# Patient Record
Sex: Female | Born: 1950 | Race: White | Hispanic: No | Marital: Married | Smoking: Former smoker
Health system: Southern US, Community
[De-identification: ages and names within clinical notes are randomized; demographics above are authoritative.]

## PROBLEM LIST (undated history)

## (undated) DIAGNOSIS — T7840XA Allergy, unspecified, initial encounter: Secondary | ICD-10-CM

## (undated) DIAGNOSIS — M8580 Other specified disorders of bone density and structure, unspecified site: Secondary | ICD-10-CM

## (undated) DIAGNOSIS — M1990 Unspecified osteoarthritis, unspecified site: Secondary | ICD-10-CM

## (undated) DIAGNOSIS — C439 Malignant melanoma of skin, unspecified: Secondary | ICD-10-CM

## (undated) DIAGNOSIS — M810 Age-related osteoporosis without current pathological fracture: Secondary | ICD-10-CM

## (undated) DIAGNOSIS — I10 Essential (primary) hypertension: Secondary | ICD-10-CM

## (undated) HISTORY — DX: Allergy, unspecified, initial encounter: T78.40XA

## (undated) HISTORY — DX: Malignant melanoma of skin, unspecified: C43.9

## (undated) HISTORY — PX: FRACTURE SURGERY: SHX138

## (undated) HISTORY — DX: Age-related osteoporosis without current pathological fracture: M81.0

## (undated) HISTORY — PX: ABDOMINAL HYSTERECTOMY: SHX81

## (undated) HISTORY — DX: Essential (primary) hypertension: I10

## (undated) HISTORY — PX: BREAST EXCISIONAL BIOPSY: SUR124

## (undated) HISTORY — DX: Unspecified osteoarthritis, unspecified site: M19.90

## (undated) HISTORY — DX: Other specified disorders of bone density and structure, unspecified site: M85.80

---

## 1982-10-24 HISTORY — PX: TUBAL LIGATION: SHX77

## 2000-02-08 ENCOUNTER — Encounter: Admission: RE | Admit: 2000-02-08 | Discharge: 2000-02-08 | Payer: Self-pay | Admitting: Obstetrics and Gynecology

## 2000-02-08 ENCOUNTER — Encounter: Payer: Self-pay | Admitting: Obstetrics and Gynecology

## 2000-02-14 ENCOUNTER — Encounter: Admission: RE | Admit: 2000-02-14 | Discharge: 2000-02-14 | Payer: Self-pay | Admitting: Obstetrics and Gynecology

## 2000-02-14 ENCOUNTER — Encounter: Payer: Self-pay | Admitting: Obstetrics and Gynecology

## 2000-07-28 ENCOUNTER — Other Ambulatory Visit: Admission: RE | Admit: 2000-07-28 | Discharge: 2000-07-28 | Payer: Self-pay | Admitting: Obstetrics and Gynecology

## 2000-07-31 ENCOUNTER — Encounter (INDEPENDENT_AMBULATORY_CARE_PROVIDER_SITE_OTHER): Payer: Self-pay | Admitting: Specialist

## 2000-07-31 ENCOUNTER — Other Ambulatory Visit: Admission: RE | Admit: 2000-07-31 | Discharge: 2000-07-31 | Payer: Self-pay | Admitting: Obstetrics and Gynecology

## 2000-10-24 HISTORY — PX: SALPINGOOPHORECTOMY: SHX82

## 2001-02-15 ENCOUNTER — Encounter: Payer: Self-pay | Admitting: Obstetrics and Gynecology

## 2001-02-15 ENCOUNTER — Encounter: Admission: RE | Admit: 2001-02-15 | Discharge: 2001-02-15 | Payer: Self-pay | Admitting: Obstetrics and Gynecology

## 2001-09-05 ENCOUNTER — Other Ambulatory Visit: Admission: RE | Admit: 2001-09-05 | Discharge: 2001-09-05 | Payer: Self-pay | Admitting: Obstetrics and Gynecology

## 2001-09-07 ENCOUNTER — Encounter: Payer: Self-pay | Admitting: Obstetrics and Gynecology

## 2001-09-07 ENCOUNTER — Ambulatory Visit (HOSPITAL_COMMUNITY): Admission: RE | Admit: 2001-09-07 | Discharge: 2001-09-07 | Payer: Self-pay | Admitting: Obstetrics and Gynecology

## 2001-09-13 ENCOUNTER — Encounter (INDEPENDENT_AMBULATORY_CARE_PROVIDER_SITE_OTHER): Payer: Self-pay | Admitting: Specialist

## 2001-09-13 ENCOUNTER — Inpatient Hospital Stay (HOSPITAL_COMMUNITY): Admission: RE | Admit: 2001-09-13 | Discharge: 2001-09-15 | Payer: Self-pay | Admitting: Obstetrics and Gynecology

## 2002-02-18 ENCOUNTER — Encounter: Payer: Self-pay | Admitting: Obstetrics and Gynecology

## 2002-02-18 ENCOUNTER — Encounter: Admission: RE | Admit: 2002-02-18 | Discharge: 2002-02-18 | Payer: Self-pay | Admitting: Obstetrics and Gynecology

## 2002-03-11 ENCOUNTER — Ambulatory Visit (HOSPITAL_BASED_OUTPATIENT_CLINIC_OR_DEPARTMENT_OTHER): Admission: RE | Admit: 2002-03-11 | Discharge: 2002-03-11 | Payer: Self-pay | Admitting: *Deleted

## 2002-03-11 ENCOUNTER — Encounter (INDEPENDENT_AMBULATORY_CARE_PROVIDER_SITE_OTHER): Payer: Self-pay | Admitting: *Deleted

## 2002-09-09 ENCOUNTER — Other Ambulatory Visit: Admission: RE | Admit: 2002-09-09 | Discharge: 2002-09-09 | Payer: Self-pay | Admitting: Obstetrics and Gynecology

## 2002-10-24 HISTORY — PX: OTHER SURGICAL HISTORY: SHX169

## 2003-02-21 ENCOUNTER — Encounter: Admission: RE | Admit: 2003-02-21 | Discharge: 2003-02-21 | Payer: Self-pay | Admitting: *Deleted

## 2003-02-21 ENCOUNTER — Encounter: Payer: Self-pay | Admitting: *Deleted

## 2003-07-02 ENCOUNTER — Emergency Department (HOSPITAL_COMMUNITY): Admission: EM | Admit: 2003-07-02 | Discharge: 2003-07-02 | Payer: Self-pay | Admitting: Emergency Medicine

## 2003-07-02 ENCOUNTER — Encounter: Payer: Self-pay | Admitting: Emergency Medicine

## 2003-12-25 ENCOUNTER — Other Ambulatory Visit: Admission: RE | Admit: 2003-12-25 | Discharge: 2003-12-25 | Payer: Self-pay | Admitting: Obstetrics and Gynecology

## 2004-02-24 ENCOUNTER — Encounter: Admission: RE | Admit: 2004-02-24 | Discharge: 2004-02-24 | Payer: Self-pay | Admitting: Obstetrics and Gynecology

## 2004-03-03 ENCOUNTER — Encounter (HOSPITAL_COMMUNITY): Admission: RE | Admit: 2004-03-03 | Discharge: 2004-06-01 | Payer: Self-pay | Admitting: Obstetrics and Gynecology

## 2004-10-24 HISTORY — PX: EYE SURGERY: SHX253

## 2004-12-06 ENCOUNTER — Ambulatory Visit: Payer: Self-pay | Admitting: Internal Medicine

## 2004-12-13 ENCOUNTER — Ambulatory Visit: Payer: Self-pay | Admitting: Oncology

## 2005-01-04 ENCOUNTER — Ambulatory Visit: Payer: Self-pay | Admitting: Internal Medicine

## 2005-02-03 ENCOUNTER — Other Ambulatory Visit: Admission: RE | Admit: 2005-02-03 | Discharge: 2005-02-03 | Payer: Self-pay | Admitting: Obstetrics and Gynecology

## 2005-03-17 ENCOUNTER — Encounter: Admission: RE | Admit: 2005-03-17 | Discharge: 2005-03-17 | Payer: Self-pay | Admitting: Obstetrics and Gynecology

## 2005-06-13 ENCOUNTER — Ambulatory Visit: Payer: Self-pay | Admitting: Hematology and Oncology

## 2005-12-14 ENCOUNTER — Ambulatory Visit: Payer: Self-pay | Admitting: Hematology and Oncology

## 2006-03-21 ENCOUNTER — Encounter: Admission: RE | Admit: 2006-03-21 | Discharge: 2006-03-21 | Payer: Self-pay | Admitting: Obstetrics and Gynecology

## 2006-04-07 ENCOUNTER — Encounter: Admission: RE | Admit: 2006-04-07 | Discharge: 2006-04-07 | Payer: Self-pay | Admitting: Obstetrics and Gynecology

## 2006-05-17 ENCOUNTER — Other Ambulatory Visit: Admission: RE | Admit: 2006-05-17 | Discharge: 2006-05-17 | Payer: Self-pay | Admitting: Obstetrics and Gynecology

## 2006-08-09 ENCOUNTER — Ambulatory Visit: Payer: Self-pay | Admitting: Hematology and Oncology

## 2007-03-23 ENCOUNTER — Encounter: Admission: RE | Admit: 2007-03-23 | Discharge: 2007-03-23 | Payer: Self-pay | Admitting: Obstetrics and Gynecology

## 2007-09-07 ENCOUNTER — Encounter: Admission: RE | Admit: 2007-09-07 | Discharge: 2007-09-07 | Payer: Self-pay | Admitting: Obstetrics and Gynecology

## 2008-03-25 ENCOUNTER — Encounter: Admission: RE | Admit: 2008-03-25 | Discharge: 2008-03-25 | Payer: Self-pay | Admitting: Obstetrics and Gynecology

## 2009-03-26 ENCOUNTER — Encounter: Admission: RE | Admit: 2009-03-26 | Discharge: 2009-03-26 | Payer: Self-pay | Admitting: Obstetrics and Gynecology

## 2009-12-16 ENCOUNTER — Encounter (INDEPENDENT_AMBULATORY_CARE_PROVIDER_SITE_OTHER): Payer: Self-pay | Admitting: *Deleted

## 2010-03-29 ENCOUNTER — Encounter: Admission: RE | Admit: 2010-03-29 | Discharge: 2010-03-29 | Payer: Self-pay | Admitting: Obstetrics and Gynecology

## 2010-07-30 ENCOUNTER — Telehealth: Payer: Self-pay | Admitting: Internal Medicine

## 2010-11-23 NOTE — Letter (Signed)
Summary: Colonoscopy Letter  [facility]  [address] [place], [place] [address]   Phone: [phone]  Fax: [phone]      December 16, 2009 MRN: [id number]   [facility] [address] [place], [place]  [address]   Dear Ms. Clouatre,   According to your medical record, it is time for you to schedule a Colonoscopy. The American Cancer Society recommends this procedure as a method to detect early colon cancer. Patients with a family history of colon cancer, or a personal history of colon polyps or inflammatory bowel disease are at increased risk.  This letter has beeen generated based on the recommendations made at the time of your procedure. If you feel that in your particular situation this may no longer apply, please contact our office.  Please call our office at [phone] to schedule this appointment or to update your records at your earliest convenience.  Thank you for cooperating with [place] to provide you with the very best care possible.   Sincerely,  Hedwig Morton. Juanda Chance, M.D  [facility] [phone]

## 2010-11-23 NOTE — Progress Notes (Signed)
Summary: Schedule Colonoscopy  Phone Note Outgoing Call Call back at [employer] Phone [phone]   Call placed by: Harlow Mares CMA Duncan Dull),  July 30, 2010 10:13 AM Call placed to: Patient Summary of Call: Left a message on patients machine to call back. patient due for a colonoscopy.  Initial call taken by: Harlow Mares CMA Duncan Dull),  July 30, 2010 10:13 AM  Follow-up for Phone Call        patient is aware she is due for a colonoscopy she still has the letter but she is taking care of her family members which have all had surgery in the past 2-3 months she will call back after the first of the year to schedule.  Follow-up by: Harlow Mares CMA Duncan Dull),  August 06, 2010 3:55 PM

## 2011-02-21 ENCOUNTER — Other Ambulatory Visit: Payer: Self-pay | Admitting: Obstetrics and Gynecology

## 2011-02-21 DIAGNOSIS — Z1231 Encounter for screening mammogram for malignant neoplasm of breast: Secondary | ICD-10-CM

## 2011-03-11 NOTE — Op Note (Signed)
[facility]  Patient:    Cassandra Knight, Cassandra Knight Visit Number: [id number] MRN: [id number]          Service Type: GYN Location: 4W 0448 01 Attending Physician:  Oliver Pila Dictated by:   Alvino Chapel, M.D. Proc. Date: 09/13/01 Admit Date:  09/13/2001                             Operative Report  PREOPERATIVE DIAGNOSES: 1. Right ovarian mass with mural nodule. 2. Left ovarian simple cyst.  POSTOPERATIVE DIAGNOSIS:  Right ovarian cystadenoma, benign.  PROCEDURE:  Laparotomy, RSO, and drainage of left ovarian cyst.  SURGEON:  Alvino Chapel, M.D.  ASSISTANT:  Malachi Pro. Ambrose Mantle, M.D.  ANESTHESIA:  General.  FINDINGS:  A 7 cm ovarian cyst with septation and a mural nodule, benign on frozen section.  Left ovary with a small 2 cm cyst which was drained.  ESTIMATED BLOOD LOSS:  100 cc.  URINE OUTPUT:  150 cc, clear.  IV FLUIDS:  1200 cc LR.  DESCRIPTION OF PROCEDURE:  The patient was taken to the operating room where general anesthesia was obtained without difficulty.  She was then prepped and draped in a normal sterile fashion in the dorsal supine position.  A small Pfannenstiel incision was then made approximately 2 cm above the symphysis pubis and carried through to the fascial layer with sharp dissection and Bovie cautery.  The fascia was then nicked in the midline.  The incision was extended laterally with the Mayo scissors.  The inferior aspect was then grasped with Kocher clamps, elevated, and dissected off of the underlying rectus muscles.  In a similar fashion, the superior aspect was dissected off the underlying rectus muscles.  The rectus muscles were then separated in the midline and the peritoneum identified and entered sharply.  The peritoneal cavity was then noted to have a small amount of fluid in it, and the peritoneal incision was extended both superiorly and inferiorly with careful attention to avoid both bowel  and bladder.  The peritoneal cavity was then lavaged and washings obtained, and attention was turned to the patients right ovary which was elevated through the incision.  This was quite mobile.  It did appear to be simple cyst in the nature, however, had a septation and a mural nodule.  This was clamped across the infundibulopelvic ligament, and the distal end of the tube with a slightly curved Zeppelin clamp and amputated. This pedicle was then secured with two suture ligatures of 0 Vicryl, and good hemostasis was noted.  The specimen was then sent for frozen pathology which returned benign.  At that point, the left ovary was drained with a small, approximately 2 cm simple cyst.  The abdomen and pelvis were inspected without findings and apparently within normal limits and no abnormality seen. Therefore, all instruments and sponges were removed from the abdomen, and the rectus muscles were reapproximated with one interrupted suture of 0 Vicryl. The fascia was closed with 0 Vicryl in a running fashion.  The subcutaneous tissue was reapproximated with several interrupted sutures of 0 Vicryl and the skin was closed with staples.  Sponge, lap, and needle counts were correct x 2, and the patient was taken to the recovery room in stable condition. Dictated by:   Alvino Chapel, M.D. Attending Physician:  Oliver Pila DD:  09/13/01 TD:  09/14/01 Job: [id number]

## 2011-03-11 NOTE — H&P (Signed)
[facility]  Patient:    Cassandra Knight, Cassandra Knight Visit Number: [id number] MRN: [id number]          Service Type: Attending:  Alvino Chapel, M.D. Dictated by:   Alvino Chapel, M.D. Adm. Date:  09/13/01                           History and Physical  HISTORY OF PRESENT ILLNESS:  Cassandra Knight is a 60 year old G3, P3, who is admitted for scheduled laparotomy, removal of right ovarian mass, and possible left ovarian cystectomy.  The patient presented for her annual GYN exam in November and at that time was noted to have a palpable right mass which was evaluated with ultrasound.  On ultrasound, a large 7.5 cm cyst was demonstrated with a single septation; however, it also had a mural nodule. Given this finding, the patient was given the option of surgical removal to rule out ovarian carcinoma and desired to proceed.  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  PAST SURGICAL HISTORY: 1. Lumpectomy. 2. Tubal ligation.  OBSTETRICAL HISTORY:  Three vaginal deliveries.  GYNECOLOGICAL HISTORY:  History of cryosurgery in 1999, and Pap smears normalized after that point.  FAMILY HISTORY:  Breast cancer in her mother in the 57s, and a father with colorectal cancer.  PHYSICAL EXAMINATION:  VITAL SIGNS:  Weight 122.  Blood pressure 130/70.  Hemoglobin 14.2.  BREASTS:  Fibrocystic changes in the right breast at 9 oclock.  No other significant masses or adenopathy.  CARDIAC:  Regular rate and rhythm.  LUNGS:  Clear.  ABDOMEN:  Soft and nontender.  PELVIC:  Normal external genitalia.  The cervix has no lesions.  Uterus is anteverted.  There is a right adnexal fullness palpable, and the left adnexa appears slightly enlarged.  RECTAL:  Heme-negative.  MEDICATIONS:  Tamoxifen only.  ALLERGIES:  No known drug allergies.  LABORATORY DATA:  Her mammogram in April 2002, was normal.  PLAN:  The patient was counseled as  to possible surgical options including laparoscopy versus minilaparotomy and removal of the ovary in its entirety. It was discussed with her that this represents a low cancer risk; however, given the mural nodule, removal is indicated to completely rule out this possibility.  The patient, even with the small chance of malignancy, would not to risk spill of the ovarian contents and, therefore, wishes to proceed with a minilaparotomy rather than laparoscopy.  The risks were discussed with the patient including bleeding, infection, and possible damage to bowel and bladder, and the patient understands these risks and desires to proceed.  In the small eventuality that this was, indeed, ovarian carcinoma, the patient wishes [place] to proceed with a full staging procedure at the time of surgery. Dictated by:   Alvino Chapel, M.D. Attending:  Alvino Chapel, M.D. DD:  09/12/01 TD:  09/12/01 Job: [id number]

## 2011-03-11 NOTE — Op Note (Signed)
[place]  Patient:    Cassandra Knight, Cassandra Knight Visit Number: [id number] MRN: [id number]          Service Type: DSU Location: [facility] Attending Physician:  Kandis Mannan Dictated by:   Donnie Coffin Samuella Cota, M.D. Proc. Date: 03/11/02 Admit Date:  03/11/2002   CC:         Dr. Huel Cote   Operative Report  CCS# [id number]  PREOPERATIVE DIAGNOSIS:  Mass, right breast with history of lobular carcinoma in situ, right breast.  POSTOPERATIVE DIAGNOSIS:  Mass, right breast with history of lobular carcinoma in situ, right breast.  OPERATION PERFORMED:  Excision of mass, right breast.  SURGEON:  Donnie Coffin. Samuella Cota, M.D.  ANESTHESIA:  1% Xylocaine local with anesthesia monitoring, anesthesiologist and CRNA.  DESCRIPTION OF PROCEDURE:  The patient was taken to the operating room and placed on the table in the supine position.  The right breast was prepped and draped as a sterile field.  The patient had a previous circumareolar incision from about 8 oclock to 12 oclock.  She had a small palpable mass at about the 8 oclock position about 2 cm from the edge of the areola.  This was marked with a curved incision using a marking pen.  1% Xylocaine local was then used to infiltrate the skin and underlying breast tissue.  The curved incision was made and once the incision was made and the mass palpated, a 3-0 Vicryl suture was placed through the mass for traction and the mass was then excised.  The patient had quite fatty breasts with very little fibrous tissue. The specimen was removed and it seemed to be mostly fat but did have some fibrous type breast tissue in it.  A finger was placed into the wound and around the periphery no other areas were felt.  Bleeding was controlled with the cautery.  The wound was closed with interrupted sutures of 3-0 Vicryl and the skin was closed with a running subcuticular suture of 4-0 Vicryl reinforced with benzoin and half inch  Steri-Strips.  4 x 4s and 4 inch Hypafix was used to dress the wound.  The patient seemed to tolerate the procedure well and was taken to the PACU in satisfactory condition. Dictated by:   Donnie Coffin Samuella Cota, M.D. Attending Physician:  Kandis Mannan DD:  03/11/02 TD:  03/11/02 Job: [id number]

## 2011-03-31 ENCOUNTER — Ambulatory Visit
Admission: RE | Admit: 2011-03-31 | Discharge: 2011-03-31 | Disposition: A | Discharge status: Home / Self Care | Payer: PRIVATE HEALTH INSURANCE | Source: Ambulatory Visit | Attending: Obstetrics and Gynecology | Admitting: Obstetrics and Gynecology

## 2011-03-31 DIAGNOSIS — Z1231 Encounter for screening mammogram for malignant neoplasm of breast: Secondary | ICD-10-CM

## 2011-10-25 HISTORY — PX: COLONOSCOPY: SHX174

## 2012-03-01 ENCOUNTER — Other Ambulatory Visit: Payer: Self-pay | Admitting: Obstetrics and Gynecology

## 2012-03-14 ENCOUNTER — Other Ambulatory Visit: Payer: Self-pay | Admitting: Obstetrics and Gynecology

## 2012-03-14 DIAGNOSIS — N63 Unspecified lump in unspecified breast: Secondary | ICD-10-CM

## 2012-03-14 DIAGNOSIS — Z780 Asymptomatic menopausal state: Secondary | ICD-10-CM

## 2012-04-06 ENCOUNTER — Ambulatory Visit
Admission: RE | Admit: 2012-04-06 | Discharge: 2012-04-06 | Disposition: A | Discharge status: Home / Self Care | Payer: PRIVATE HEALTH INSURANCE | Source: Ambulatory Visit | Attending: Obstetrics and Gynecology | Admitting: Obstetrics and Gynecology

## 2012-04-06 ENCOUNTER — Other Ambulatory Visit: Payer: Self-pay | Admitting: Obstetrics and Gynecology

## 2012-04-06 DIAGNOSIS — Z780 Asymptomatic menopausal state: Secondary | ICD-10-CM

## 2012-04-06 DIAGNOSIS — N63 Unspecified lump in unspecified breast: Secondary | ICD-10-CM

## 2012-05-10 ENCOUNTER — Encounter: Payer: Self-pay | Admitting: Internal Medicine

## 2012-06-29 ENCOUNTER — Ambulatory Visit (AMBULATORY_SURGERY_CENTER): Payer: PRIVATE HEALTH INSURANCE

## 2012-06-29 VITALS — Ht 60.0 in | Wt 130.4 lb

## 2012-06-29 DIAGNOSIS — Z1211 Encounter for screening for malignant neoplasm of colon: Secondary | ICD-10-CM

## 2012-06-29 MED ORDER — MOVIPREP 100 G PO SOLR: 1.0000 | Freq: Once | ORAL | Status: DC

## 2012-07-03 ENCOUNTER — Encounter: Payer: Self-pay | Admitting: Internal Medicine

## 2012-07-13 ENCOUNTER — Encounter: Payer: Self-pay | Admitting: Internal Medicine

## 2012-07-13 ENCOUNTER — Ambulatory Visit (AMBULATORY_SURGERY_CENTER): Payer: PRIVATE HEALTH INSURANCE | Admitting: Internal Medicine

## 2012-07-13 VITALS — BP 144/91 | HR 98 | Temp 97.5°F | Resp 16 | Ht 60.0 in | Wt 130.0 lb

## 2012-07-13 DIAGNOSIS — Z800 Family history of malignant neoplasm of digestive organs: Secondary | ICD-10-CM

## 2012-07-13 DIAGNOSIS — Z1211 Encounter for screening for malignant neoplasm of colon: Secondary | ICD-10-CM

## 2012-07-13 MED ORDER — SODIUM CHLORIDE 0.9 % IV SOLN: 500.0000 mL | INTRAVENOUS | Status: DC

## 2012-07-13 NOTE — Patient Instructions (Addendum)

## 2012-07-13 NOTE — Op Note (Signed)
[facility] [address] [place], [address]   COLONOSCOPY PROCEDURE REPORT  PATIENT: Cassandra Knight, Cassandra Knight  MR#: [id number] BIRTHDATE: Oct 10, 1951 , 60  yrs. old GENDER: Female ENDOSCOPIST: Hart Carwin, MD REFERRED BY:  Huel Cote, M.D. PROCEDURE DATE:  07/13/2012 PROCEDURE:   Colonoscopy, screening ASA CLASS:   Class I INDICATIONS:patient's immediate family history of colon cancer and father with colon cancer, last colon 2006. MEDICATIONS: MAC sedation, administered by CRNA and Propofol (Diprivan) 150 mg IV  DESCRIPTION OF PROCEDURE:   After the risks and benefits and of the procedure were explained, informed consent was obtained.  A digital rectal exam revealed no abnormalities of the rectum.    The LB CF-[id number]  endoscope was introduced through the anus and advanced to the cecum, which was identified by both the appendix and ileocecal valve .  The quality of the prep was good, using MoviPrep .  The instrument was then slowly withdrawn as the colon was fully examined.     COLON FINDINGS: Mild diverticulosis was noted in the sigmoid colon. Retroflexed views revealed no abnormalities.     The scope was then withdrawn from the patient and the procedure completed.  COMPLICATIONS: There were no complications. ENDOSCOPIC IMPRESSION: Mild diverticulosis was noted in the sigmoid colon  RECOMMENDATIONS: High fiber diet   REPEAT EXAM: In 5 year(s)  for Colonoscopy.  cc:  _______________________________ eSignedHart Carwin, MD 07/13/2012 9:40 AM

## 2012-07-13 NOTE — Progress Notes (Signed)
Patient did not experience any of the following events: a burn prior to discharge; a fall within the facility; wrong site/side/patient/procedure/implant event; or a hospital transfer or hospital admission upon discharge from the facility. (G8907) Patient did not have preoperative order for IV antibiotic SSI prophylaxis. (G8918)  

## 2012-07-16 ENCOUNTER — Telehealth: Payer: Self-pay | Admitting: *Deleted

## 2012-07-16 NOTE — Telephone Encounter (Signed)
  Follow up Call-  Call back number 07/13/2012  Post procedure Call Back phone  # [phone]  Permission to leave phone message Yes     Patient questions:  Do you have a fever, pain , or abdominal swelling? no Pain Score  0 *  Have you tolerated food without any problems? yes  Have you been able to return to your normal activities? yes  Do you have any questions about your discharge instructions: Diet   no Medications  no Follow up visit  no  Do you have questions or concerns about your Care? no  Actions: * If pain score is 4 or above: No action needed, pain <4.

## 2013-03-05 ENCOUNTER — Other Ambulatory Visit: Payer: Self-pay

## 2013-03-05 DIAGNOSIS — Z1231 Encounter for screening mammogram for malignant neoplasm of breast: Secondary | ICD-10-CM

## 2013-04-15 ENCOUNTER — Ambulatory Visit
Admission: RE | Admit: 2013-04-15 | Discharge: 2013-04-15 | Disposition: A | Discharge status: Home / Self Care | Payer: PRIVATE HEALTH INSURANCE | Source: Ambulatory Visit

## 2013-04-15 DIAGNOSIS — Z1231 Encounter for screening mammogram for malignant neoplasm of breast: Secondary | ICD-10-CM

## 2013-11-26 ENCOUNTER — Other Ambulatory Visit: Payer: Self-pay | Admitting: Obstetrics and Gynecology

## 2013-11-26 DIAGNOSIS — M810 Age-related osteoporosis without current pathological fracture: Secondary | ICD-10-CM

## 2014-04-11 ENCOUNTER — Other Ambulatory Visit: Payer: Self-pay

## 2014-04-11 ENCOUNTER — Encounter (INDEPENDENT_AMBULATORY_CARE_PROVIDER_SITE_OTHER): Payer: Self-pay

## 2014-04-11 ENCOUNTER — Ambulatory Visit
Admission: RE | Admit: 2014-04-11 | Discharge: 2014-04-11 | Disposition: A | Discharge status: Home / Self Care | Payer: PRIVATE HEALTH INSURANCE | Source: Ambulatory Visit | Attending: Obstetrics and Gynecology | Admitting: Obstetrics and Gynecology

## 2014-04-11 DIAGNOSIS — Z1231 Encounter for screening mammogram for malignant neoplasm of breast: Secondary | ICD-10-CM

## 2014-04-11 DIAGNOSIS — M810 Age-related osteoporosis without current pathological fracture: Secondary | ICD-10-CM

## 2014-04-21 ENCOUNTER — Ambulatory Visit
Admission: RE | Admit: 2014-04-21 | Discharge: 2014-04-21 | Disposition: A | Discharge status: Home / Self Care | Payer: PRIVATE HEALTH INSURANCE | Source: Ambulatory Visit

## 2014-04-21 DIAGNOSIS — Z1231 Encounter for screening mammogram for malignant neoplasm of breast: Secondary | ICD-10-CM

## 2014-04-23 ENCOUNTER — Other Ambulatory Visit: Payer: Self-pay | Admitting: Obstetrics and Gynecology

## 2014-04-23 DIAGNOSIS — R928 Other abnormal and inconclusive findings on diagnostic imaging of breast: Secondary | ICD-10-CM

## 2014-05-06 ENCOUNTER — Ambulatory Visit
Admission: RE | Admit: 2014-05-06 | Discharge: 2014-05-06 | Disposition: A | Discharge status: Home / Self Care | Payer: PRIVATE HEALTH INSURANCE | Source: Ambulatory Visit | Attending: Obstetrics and Gynecology | Admitting: Obstetrics and Gynecology

## 2014-05-06 DIAGNOSIS — R928 Other abnormal and inconclusive findings on diagnostic imaging of breast: Secondary | ICD-10-CM

## 2014-10-03 ENCOUNTER — Other Ambulatory Visit: Payer: Self-pay | Admitting: Obstetrics and Gynecology

## 2014-10-03 DIAGNOSIS — N6002 Solitary cyst of left breast: Secondary | ICD-10-CM

## 2014-11-06 ENCOUNTER — Ambulatory Visit
Admission: RE | Admit: 2014-11-06 | Discharge: 2014-11-06 | Disposition: A | Discharge status: Home / Self Care | Payer: PRIVATE HEALTH INSURANCE | Source: Ambulatory Visit | Attending: Obstetrics and Gynecology | Admitting: Obstetrics and Gynecology

## 2014-11-06 DIAGNOSIS — N6002 Solitary cyst of left breast: Secondary | ICD-10-CM

## 2015-05-26 ENCOUNTER — Other Ambulatory Visit: Payer: Self-pay

## 2015-05-26 DIAGNOSIS — Z1231 Encounter for screening mammogram for malignant neoplasm of breast: Secondary | ICD-10-CM

## 2015-06-02 ENCOUNTER — Ambulatory Visit
Admission: RE | Admit: 2015-06-02 | Discharge: 2015-06-02 | Disposition: A | Discharge status: Home / Self Care | Payer: PRIVATE HEALTH INSURANCE | Source: Ambulatory Visit

## 2015-06-02 DIAGNOSIS — Z1231 Encounter for screening mammogram for malignant neoplasm of breast: Secondary | ICD-10-CM

## 2015-07-07 ENCOUNTER — Encounter: Payer: Self-pay | Admitting: Internal Medicine

## 2015-09-28 ENCOUNTER — Emergency Department (HOSPITAL_COMMUNITY)
Admission: EM | Admit: 2015-09-28 | Discharge: 2015-09-29 | Disposition: A | Discharge status: Home / Self Care | Payer: BLUE CROSS/BLUE SHIELD | Attending: Emergency Medicine | Admitting: Emergency Medicine

## 2015-09-28 ENCOUNTER — Encounter (HOSPITAL_COMMUNITY): Payer: Self-pay | Admitting: Emergency Medicine

## 2015-09-28 DIAGNOSIS — R630 Anorexia: Secondary | ICD-10-CM | POA: Insufficient documentation

## 2015-09-28 DIAGNOSIS — R110 Nausea: Secondary | ICD-10-CM | POA: Diagnosis not present

## 2015-09-28 DIAGNOSIS — Z9851 Tubal ligation status: Secondary | ICD-10-CM | POA: Diagnosis not present

## 2015-09-28 DIAGNOSIS — M8580 Other specified disorders of bone density and structure, unspecified site: Secondary | ICD-10-CM | POA: Insufficient documentation

## 2015-09-28 DIAGNOSIS — R197 Diarrhea, unspecified: Secondary | ICD-10-CM | POA: Insufficient documentation

## 2015-09-28 DIAGNOSIS — Z90722 Acquired absence of ovaries, bilateral: Secondary | ICD-10-CM | POA: Insufficient documentation

## 2015-09-28 DIAGNOSIS — R1031 Right lower quadrant pain: Secondary | ICD-10-CM | POA: Diagnosis present

## 2015-09-28 DIAGNOSIS — R109 Unspecified abdominal pain: Secondary | ICD-10-CM | POA: Diagnosis not present

## 2015-09-28 DIAGNOSIS — Z79899 Other long term (current) drug therapy: Secondary | ICD-10-CM | POA: Diagnosis not present

## 2015-09-28 DIAGNOSIS — Z87891 Personal history of nicotine dependence: Secondary | ICD-10-CM | POA: Diagnosis not present

## 2015-09-28 LAB — CBC
HCT: 41.3 % (ref 36.0–46.0)
HEMOGLOBIN: 14.3 g/dL (ref 12.0–15.0)
MCH: 33.2 pg (ref 26.0–34.0)
MCHC: 34.6 g/dL (ref 30.0–36.0)
MCV: 95.8 fL (ref 78.0–100.0)
Platelets: 228 10*3/uL (ref 150–400)
RBC: 4.31 MIL/uL (ref 3.87–5.11)
RDW: 12.5 % (ref 11.5–15.5)
WBC: 7.7 10*3/uL (ref 4.0–10.5)

## 2015-09-28 LAB — COMPREHENSIVE METABOLIC PANEL
ALBUMIN: 4.5 g/dL (ref 3.5–5.0)
ALK PHOS: 68 U/L (ref 38–126)
ALT: 15 U/L (ref 14–54)
AST: 20 U/L (ref 15–41)
Anion gap: 8 (ref 5–15)
BUN: 10 mg/dL (ref 6–20)
CHLORIDE: 107 mmol/L (ref 101–111)
CO2: 24 mmol/L (ref 22–32)
Calcium: 9.6 mg/dL (ref 8.9–10.3)
Creatinine, Ser: 0.67 mg/dL (ref 0.44–1.00)
GFR calc non Af Amer: >60 mL/min (ref >60)
GLUCOSE: 103 mg/dL — AB (ref 65–99)
Potassium: 3.7 mmol/L (ref 3.5–5.1)
SODIUM: 139 mmol/L (ref 135–145)
Total Bilirubin: 0.7 mg/dL (ref 0.3–1.2)
Total Protein: 7 g/dL (ref 6.5–8.1)

## 2015-09-28 LAB — URINALYSIS, ROUTINE W REFLEX MICROSCOPIC
Bilirubin Urine: NEGATIVE
GLUCOSE, UA: NEGATIVE mg/dL
HGB URINE DIPSTICK: NEGATIVE
Ketones, ur: NEGATIVE mg/dL
Leukocytes, UA: NEGATIVE
Nitrite: NEGATIVE
Protein, ur: NEGATIVE mg/dL
SPECIFIC GRAVITY, URINE: 1.006 (ref 1.005–1.030)
pH: 6.5 (ref 5.0–8.0)

## 2015-09-28 LAB — LIPASE, BLOOD: LIPASE: 27 U/L (ref 11–51)

## 2015-09-28 MED ORDER — ONDANSETRON HCL 4 MG/2ML IJ SOLN
4.0000 mg | Freq: Once | INTRAMUSCULAR | Status: AC
  Administered 2015-09-28: 4 mg via INTRAVENOUS
  Filled 2015-09-28: qty 2

## 2015-09-28 MED ORDER — SODIUM CHLORIDE 0.9 % IV SOLN
1000.0000 mL | INTRAVENOUS | Status: DC
  Administered 2015-09-29: 1000 mL via INTRAVENOUS

## 2015-09-28 MED ORDER — MORPHINE SULFATE (PF) 4 MG/ML IV SOLN
4.0000 mg | INTRAVENOUS | Status: DC | PRN
Start: 2015-09-28 — End: 2015-09-29
  Administered 2015-09-28: 4 mg via INTRAVENOUS
  Filled 2015-09-28: qty 1

## 2015-09-28 MED ORDER — SODIUM CHLORIDE 0.9 % IV SOLN
1000.0000 mL | Freq: Once | INTRAVENOUS | Status: AC
  Administered 2015-09-29: 1000 mL via INTRAVENOUS

## 2015-09-28 NOTE — ED Notes (Signed)
Pt from home for eval of RLQ abd pain with sharp pains that come and go. Pt also reports some nausea and states has not had an appetite since 1600 today. Pt in nad noted, denies any v/d. No fevers.

## 2015-09-28 NOTE — ED Provider Notes (Signed)
CSN: [id number]     Arrival date & time 09/28/15  [id number] History  By signing my name below, I, Cassandra Knight, attest that this documentation has been prepared under the direction and in the presence of Jola Schmidt, MD. Electronically Signed: Julien Knight, ED Scribe. 09/28/2015. 11:47 PM.     Chief Complaint  Patient presents with  . Abdominal Pain      The history is provided by the patient. No language interpreter was used.   HPI Comments: Cassandra Knight is a 64 y.o. female who presents to the Emergency Department complaining of intermittent, moderate, gradual worsening, dull and aching RLQ pain onset 6 months ago. Pt has been having associated nausea, one episode of diarrhea, and loss of appetite onset today. She reports the pain has gotten gradually worse the past few days, more specifically after her diarrhea came. She says the pain would be acute onset with each episode lasting hours at a time. Pt states laying down makes her feel more comfortable than sitting up. She notes her pain is similar to previous RLQ abdominal pain she has had in the past just more constant and severe. Pt had a right oophorectomy done in the past due to a large cyst. She has had two colonoscopies with diverticulosis being found in her last one. Pt denies fever, back pain, weight changes, shortness of breath, and rash.  Past Medical History  Diagnosis Date  . Osteopenia    Past Surgical History  Procedure Laterality Date  . Tubal ligation    . Salpingoophorectomy    . Broken leg     Family History  Problem Relation Age of Onset  . Colon cancer Father    Social History  Substance Use Topics  . Smoking status: Former Smoker    Quit date: 06/29/1974  . Smokeless tobacco: Never Used  . Alcohol Use: None   OB History    No data available     Review of Systems  A complete 10 system review of systems was obtained and all systems are negative except as noted in the HPI and PMH.    Allergies   Review of patient's allergies indicates no known allergies.  Home Medications   Prior to Admission medications   Medication Sig Start Date End Date Taking? Authorizing Provider  Calcium-Vitamin D-Vitamin K (CALCIUM + D + K PO) Take 500 mg by mouth.    Historical Provider, MD   Triage vitals: BP 187/98 mmHg  Pulse 93  Temp(Src) 98 F (36.7 C) (Oral)  Resp 16  Ht 5' (1.524 m)  Wt 130 lb (58.968 kg)  BMI 25.39 kg/m2  SpO2 98% Physical Exam  Constitutional: She is oriented to person, place, and time. She appears well-developed and well-nourished. No distress.  HENT:  Head: Normocephalic and atraumatic.  Eyes: EOM are normal.  Neck: Normal range of motion.  Cardiovascular: Normal rate, regular rhythm and normal heart sounds.   Pulmonary/Chest: Effort normal and breath sounds normal.  Abdominal: Soft. She exhibits no distension. There is no tenderness.  Musculoskeletal: Normal range of motion.  Neurological: She is alert and oriented to person, place, and time.  Skin: Skin is warm and dry.  Psychiatric: She has a normal mood and affect. Judgment normal.  Nursing note and vitals reviewed.   ED Course  Procedures  DIAGNOSTIC STUDIES: Oxygen Saturation is 98% on RA, normal by my interpretation.  COORDINATION OF CARE:  11:43 PM Discussed treatment plan which includes lab work, IV fluids and CT  of abdomen with pt at bedside and pt agreed to plan.  Labs Review Labs Reviewed  COMPREHENSIVE METABOLIC PANEL - Abnormal; Notable for the following:    Glucose, Bld 103 (*)    All other components within normal limits  LIPASE, BLOOD  CBC  URINALYSIS, ROUTINE W REFLEX MICROSCOPIC (NOT AT [facility])    Imaging Review Ct Abdomen Pelvis W Contrast  09/29/2015  CLINICAL DATA:  Acute onset of right lower quadrant abdominal pain and nausea. Loss of appetite. Initial encounter. EXAM: CT ABDOMEN AND PELVIS WITH CONTRAST TECHNIQUE: Multidetector CT imaging of the abdomen and pelvis was performed  using the standard protocol following bolus administration of intravenous contrast. CONTRAST:  188mL OMNIPAQUE IOHEXOL 300 MG/ML  SOLN COMPARISON:  None. FINDINGS: Minimal bibasilar atelectasis is noted. A 1.4 cm hypodensity within the right hepatic lobe likely reflects a small cyst. The spleen is unremarkable in appearance. The gallbladder is within normal limits. The pancreas and adrenal glands are unremarkable. The kidneys are unremarkable in appearance. There is no evidence of hydronephrosis. No renal or ureteral stones are seen. No perinephric stranding is appreciated. No free fluid is identified. The small bowel is unremarkable in appearance. The stomach is within normal limits. No acute vascular abnormalities are seen. The appendix is decompressed and grossly unremarkable, though difficult to fully assess. Scattered diverticulosis is noted along the ascending and proximal transverse colon, without evidence of diverticulitis. The bladder is moderately distended and grossly unremarkable. The uterus is unremarkable in appearance. The ovaries are grossly symmetric, though difficult to fully assess. No suspicious adnexal masses are seen. No inguinal lymphadenopathy is seen. No acute osseous abnormalities are identified. IMPRESSION: 1. No acute abnormality seen within the abdomen or pelvis. 2. Scattered diverticulosis along the ascending and proximal transverse colon, without evidence of diverticulitis. 3. Likely small hepatic cyst noted. Electronically Signed   By: Garald Balding M.D.   On: 09/29/2015 01:51   I have personally reviewed and evaluated these images and lab results as part of my medical decision-making.   EKG Interpretation None      MDM   Final diagnoses:  Abdominal pain, unspecified abdominal location    2:14 AM Patient continues to feel good at this time.  CT scan without obvious pathology noted.  Doubt diverticulitis despite the diverticulosis that was noted on the ascending colon.   Unable to completely reproduce the patient's pain.  Her abdomen.  She'll be referred back to her gastroenterologist.  She's been asked to follow-up with her primary care physician next 3 days if she is not improving.  She understands to return to the emergency department for new or worsening symptoms.  All questions answered.  I personally performed the services described in this documentation, which was scribed in my presence. The recorded information has been reviewed and is accurate.      Jola Schmidt, MD 09/29/15 [phone]

## 2015-09-29 ENCOUNTER — Emergency Department (HOSPITAL_COMMUNITY): Payer: BLUE CROSS/BLUE SHIELD

## 2015-09-29 MED ORDER — IBUPROFEN 600 MG PO TABS: 600.0000 mg | ORAL_TABLET | Freq: Three times a day (TID) | ORAL | Status: AC | PRN

## 2015-09-29 MED ORDER — ONDANSETRON 8 MG PO TBDP: 8.0000 mg | ORAL_TABLET | Freq: Three times a day (TID) | ORAL | Status: DC | PRN

## 2015-09-29 MED ORDER — IOHEXOL 300 MG/ML  SOLN
100.0000 mL | Freq: Once | INTRAMUSCULAR | Status: AC | PRN
  Administered 2015-09-29: 100 mL via INTRAVENOUS

## 2015-09-29 NOTE — Discharge Instructions (Signed)

## 2016-06-22 ENCOUNTER — Other Ambulatory Visit: Payer: Self-pay | Admitting: Obstetrics and Gynecology

## 2016-06-22 DIAGNOSIS — Z1231 Encounter for screening mammogram for malignant neoplasm of breast: Secondary | ICD-10-CM

## 2016-07-04 ENCOUNTER — Ambulatory Visit
Admission: RE | Admit: 2016-07-04 | Discharge: 2016-07-04 | Disposition: A | Discharge status: Home / Self Care | Payer: BLUE CROSS/BLUE SHIELD | Source: Ambulatory Visit | Attending: Obstetrics and Gynecology | Admitting: Obstetrics and Gynecology

## 2016-07-04 DIAGNOSIS — Z1231 Encounter for screening mammogram for malignant neoplasm of breast: Secondary | ICD-10-CM

## 2016-07-06 ENCOUNTER — Other Ambulatory Visit: Payer: Self-pay | Admitting: Obstetrics and Gynecology

## 2016-07-06 DIAGNOSIS — R928 Other abnormal and inconclusive findings on diagnostic imaging of breast: Secondary | ICD-10-CM

## 2016-07-11 ENCOUNTER — Ambulatory Visit
Admission: RE | Admit: 2016-07-11 | Discharge: 2016-07-11 | Disposition: A | Discharge status: Home / Self Care | Payer: BLUE CROSS/BLUE SHIELD | Source: Ambulatory Visit | Attending: Obstetrics and Gynecology | Admitting: Obstetrics and Gynecology

## 2016-07-11 DIAGNOSIS — R928 Other abnormal and inconclusive findings on diagnostic imaging of breast: Secondary | ICD-10-CM

## 2016-10-24 HISTORY — PX: MELANOMA EXCISION: SHX5266

## 2016-12-14 ENCOUNTER — Other Ambulatory Visit: Payer: Self-pay | Admitting: Obstetrics and Gynecology

## 2016-12-14 DIAGNOSIS — M810 Age-related osteoporosis without current pathological fracture: Secondary | ICD-10-CM

## 2016-12-14 DIAGNOSIS — N63 Unspecified lump in unspecified breast: Secondary | ICD-10-CM

## 2016-12-29 ENCOUNTER — Ambulatory Visit
Admission: RE | Admit: 2016-12-29 | Discharge: 2016-12-29 | Disposition: A | Discharge status: Home / Self Care | Payer: BLUE CROSS/BLUE SHIELD | Source: Ambulatory Visit | Attending: Obstetrics and Gynecology | Admitting: Obstetrics and Gynecology

## 2016-12-29 DIAGNOSIS — N63 Unspecified lump in unspecified breast: Secondary | ICD-10-CM

## 2016-12-29 DIAGNOSIS — M810 Age-related osteoporosis without current pathological fracture: Secondary | ICD-10-CM

## 2017-07-19 ENCOUNTER — Other Ambulatory Visit: Payer: Self-pay | Admitting: Obstetrics and Gynecology

## 2017-07-19 DIAGNOSIS — N6002 Solitary cyst of left breast: Secondary | ICD-10-CM

## 2017-07-25 ENCOUNTER — Ambulatory Visit
Admission: RE | Admit: 2017-07-25 | Discharge: 2017-07-25 | Disposition: A | Discharge status: Home / Self Care | Payer: BLUE CROSS/BLUE SHIELD | Source: Ambulatory Visit | Attending: Obstetrics and Gynecology | Admitting: Obstetrics and Gynecology

## 2017-07-25 DIAGNOSIS — N6002 Solitary cyst of left breast: Secondary | ICD-10-CM

## 2017-07-28 ENCOUNTER — Encounter: Payer: Self-pay | Admitting: Gastroenterology

## 2018-07-25 ENCOUNTER — Other Ambulatory Visit: Payer: Self-pay | Admitting: Obstetrics and Gynecology

## 2018-07-25 DIAGNOSIS — N649 Disorder of breast, unspecified: Secondary | ICD-10-CM

## 2018-08-03 ENCOUNTER — Ambulatory Visit
Admission: RE | Admit: 2018-08-03 | Discharge: 2018-08-03 | Disposition: A | Discharge status: Home / Self Care | Payer: BLUE CROSS/BLUE SHIELD | Source: Ambulatory Visit | Attending: Obstetrics and Gynecology | Admitting: Obstetrics and Gynecology

## 2018-08-03 DIAGNOSIS — N649 Disorder of breast, unspecified: Secondary | ICD-10-CM

## 2019-07-05 ENCOUNTER — Other Ambulatory Visit: Payer: Self-pay | Admitting: Obstetrics and Gynecology

## 2019-07-05 DIAGNOSIS — Z1231 Encounter for screening mammogram for malignant neoplasm of breast: Secondary | ICD-10-CM

## 2019-08-21 ENCOUNTER — Ambulatory Visit
Admission: RE | Admit: 2019-08-21 | Discharge: 2019-08-21 | Disposition: A | Discharge status: Home / Self Care | Payer: Medicare Other | Source: Ambulatory Visit | Attending: Obstetrics and Gynecology | Admitting: Obstetrics and Gynecology

## 2019-08-21 ENCOUNTER — Other Ambulatory Visit: Payer: Self-pay

## 2019-08-21 DIAGNOSIS — Z1231 Encounter for screening mammogram for malignant neoplasm of breast: Secondary | ICD-10-CM

## 2019-12-05 ENCOUNTER — Ambulatory Visit: Payer: Medicare Other

## 2020-07-07 ENCOUNTER — Other Ambulatory Visit: Payer: Self-pay | Admitting: Obstetrics and Gynecology

## 2020-07-07 DIAGNOSIS — Z1231 Encounter for screening mammogram for malignant neoplasm of breast: Secondary | ICD-10-CM

## 2020-07-24 DIAGNOSIS — Z85820 Personal history of malignant melanoma of skin: Secondary | ICD-10-CM | POA: Diagnosis not present

## 2020-07-24 DIAGNOSIS — D2262 Melanocytic nevi of left upper limb, including shoulder: Secondary | ICD-10-CM | POA: Diagnosis not present

## 2020-07-24 DIAGNOSIS — Z23 Encounter for immunization: Secondary | ICD-10-CM | POA: Diagnosis not present

## 2020-07-24 DIAGNOSIS — D2261 Melanocytic nevi of right upper limb, including shoulder: Secondary | ICD-10-CM | POA: Diagnosis not present

## 2020-08-21 ENCOUNTER — Ambulatory Visit: Payer: Medicare Other

## 2020-09-22 ENCOUNTER — Ambulatory Visit
Admission: RE | Admit: 2020-09-22 | Discharge: 2020-09-22 | Disposition: A | Discharge status: Home / Self Care | Payer: Medicare Other | Source: Ambulatory Visit | Attending: Obstetrics and Gynecology | Admitting: Obstetrics and Gynecology

## 2020-09-22 ENCOUNTER — Other Ambulatory Visit: Payer: Self-pay

## 2020-09-22 DIAGNOSIS — Z1231 Encounter for screening mammogram for malignant neoplasm of breast: Secondary | ICD-10-CM | POA: Diagnosis not present

## 2020-11-03 DIAGNOSIS — Z1152 Encounter for screening for COVID-19: Secondary | ICD-10-CM | POA: Diagnosis not present

## 2021-08-10 ENCOUNTER — Other Ambulatory Visit: Payer: Self-pay | Admitting: Obstetrics and Gynecology

## 2021-08-10 DIAGNOSIS — Z1231 Encounter for screening mammogram for malignant neoplasm of breast: Secondary | ICD-10-CM

## 2021-09-23 ENCOUNTER — Ambulatory Visit
Admission: RE | Admit: 2021-09-23 | Discharge: 2021-09-23 | Disposition: A | Discharge status: Home / Self Care | Payer: Medicare Other | Source: Ambulatory Visit | Attending: Obstetrics and Gynecology | Admitting: Obstetrics and Gynecology

## 2021-09-23 DIAGNOSIS — Z1231 Encounter for screening mammogram for malignant neoplasm of breast: Secondary | ICD-10-CM | POA: Diagnosis not present

## 2021-12-27 DIAGNOSIS — Z23 Encounter for immunization: Secondary | ICD-10-CM | POA: Diagnosis not present

## 2021-12-27 DIAGNOSIS — L608 Other nail disorders: Secondary | ICD-10-CM | POA: Diagnosis not present

## 2022-01-10 IMAGING — MG MM DIGITAL SCREENING BILAT W/ TOMO AND CAD
8 series · 8 of 24 positions shown · non-contrast
Comparison: Previous exam(s).

CLINICAL DATA: Screening.

EXAM:
DIGITAL SCREENING BILATERAL MAMMOGRAM WITH TOMOSYNTHESIS AND CAD
TECHNIQUE: Bilateral screening digital craniocaudal and mediolateral oblique
mammograms were obtained. Bilateral screening digital breast
tomosynthesis was performed. The images were evaluated with
computer-aided detection.

[L MLO synth-2D]
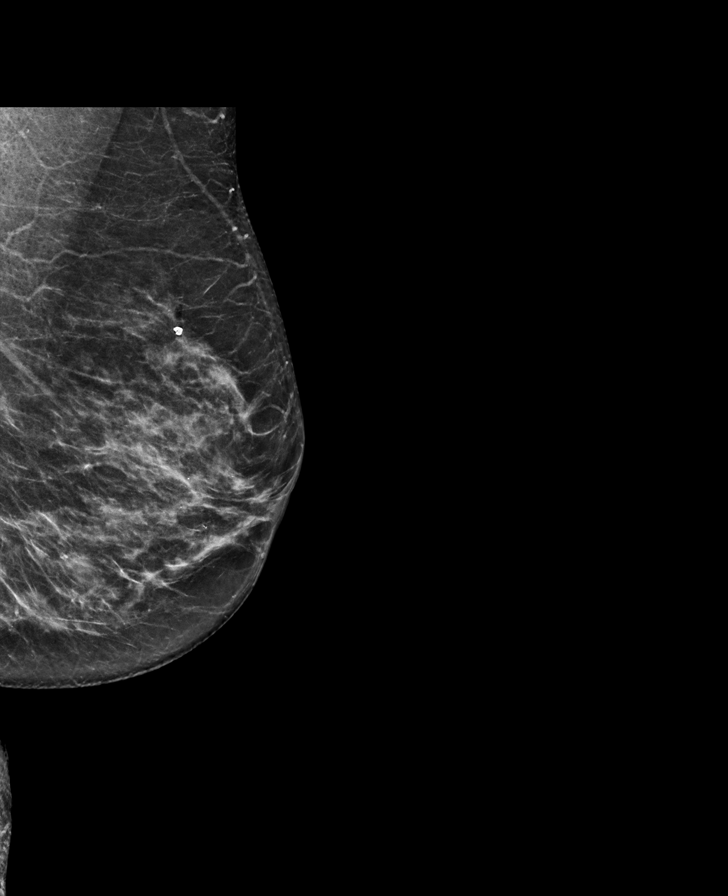

[L CC synth-2D]
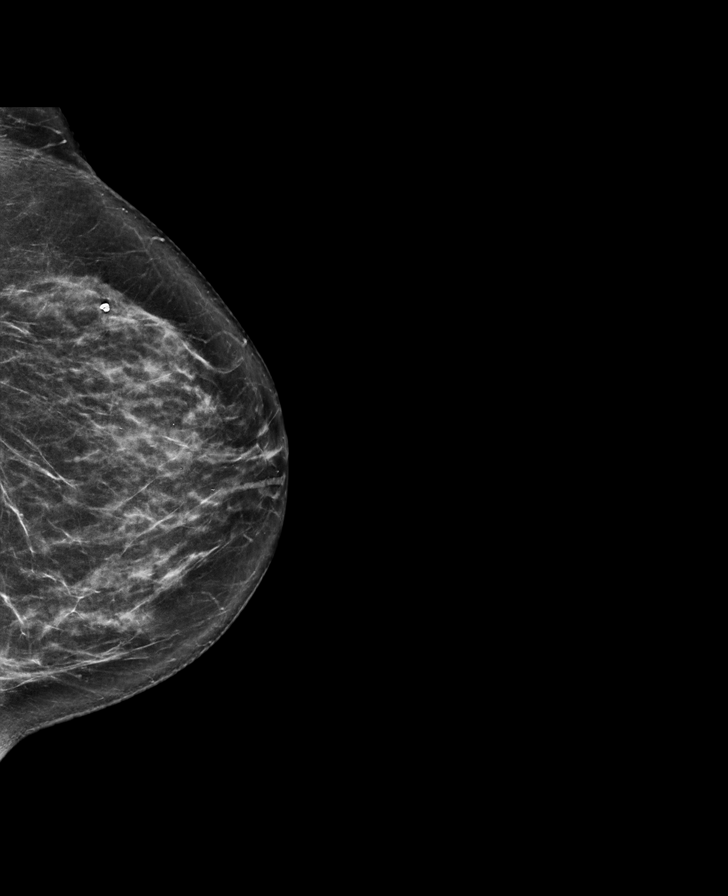

[R CC synth-2D]
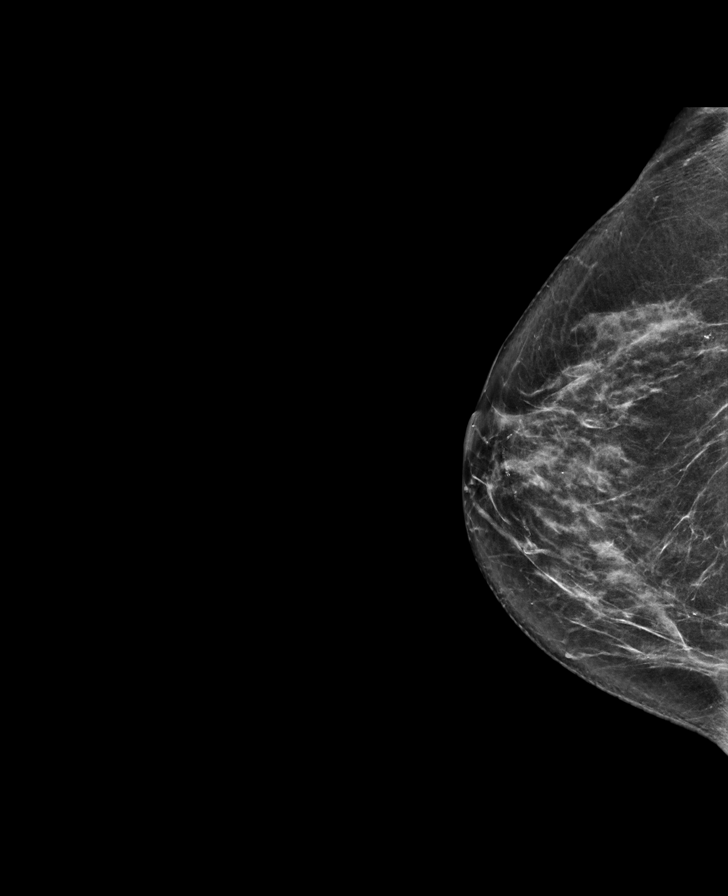

[R MLO synth-2D]
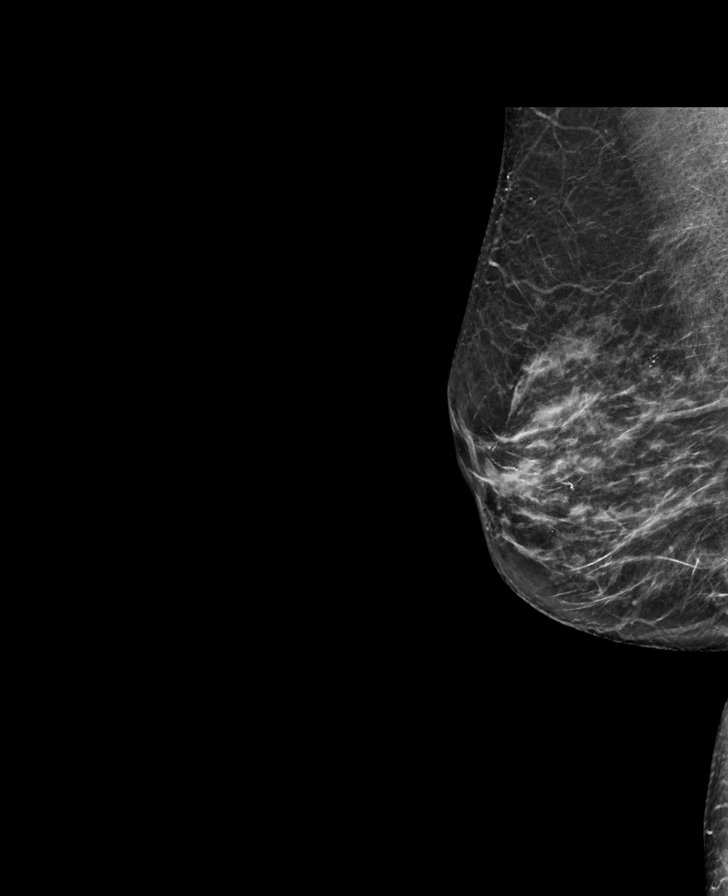

[L CC tomo · tomo slice 35/69.0]
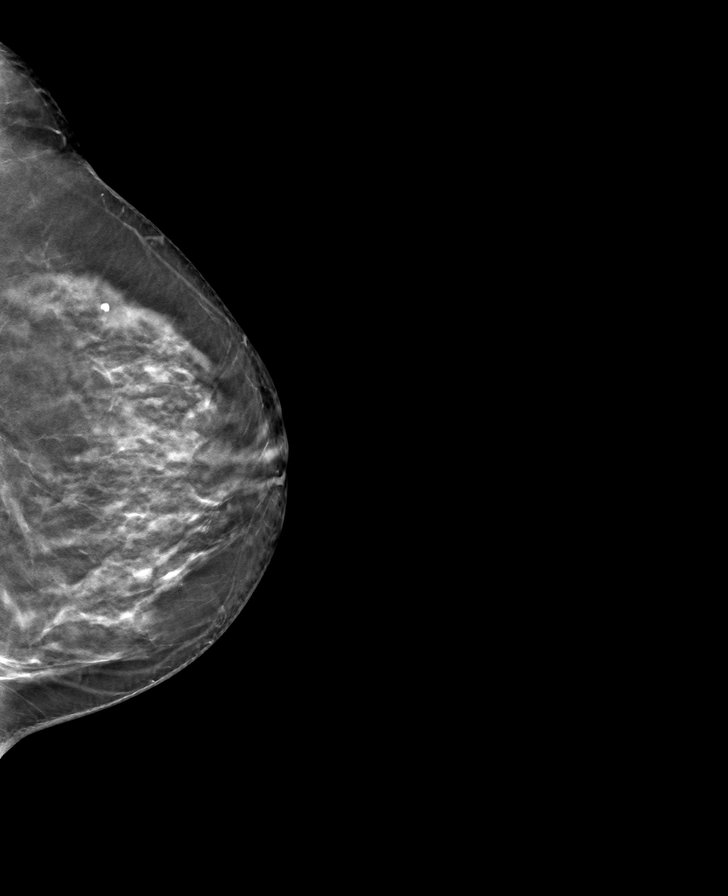

[L MLO tomo · tomo slice 33/66.0]
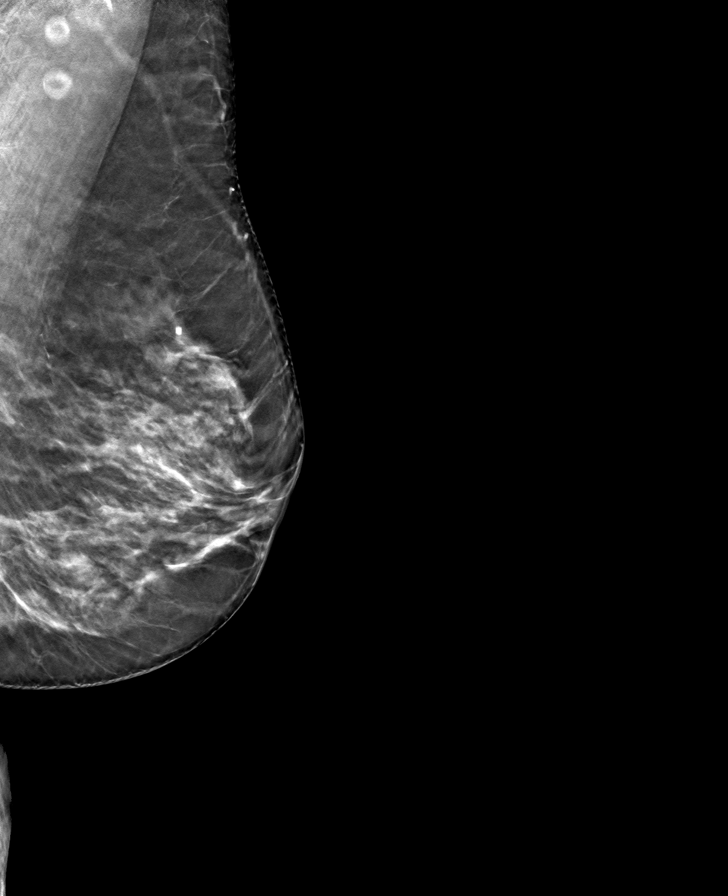

[R CC tomo · tomo slice 34/67.0]
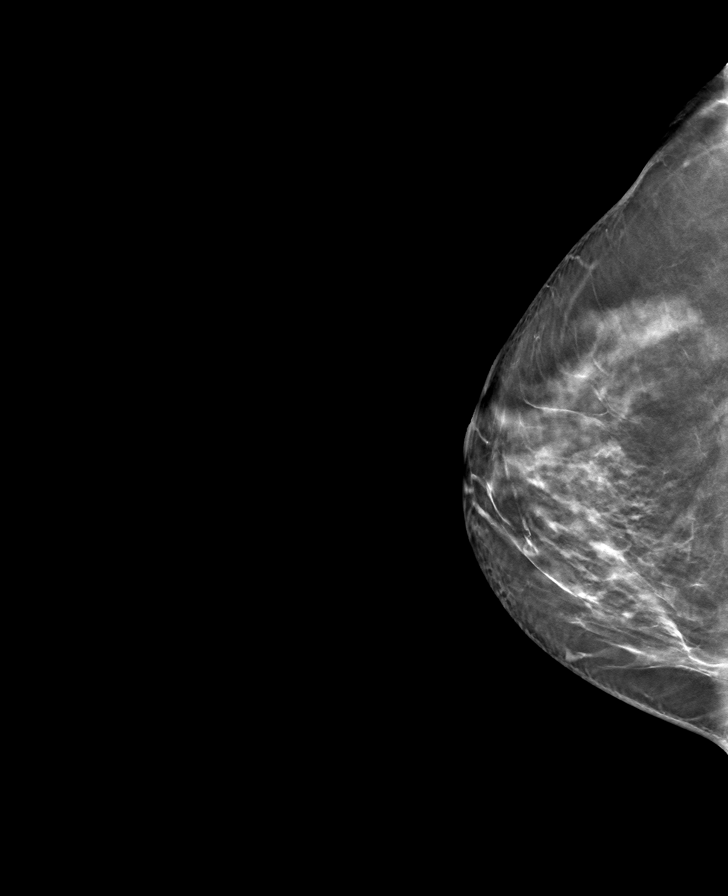

[R MLO tomo · tomo slice 32/63.0]
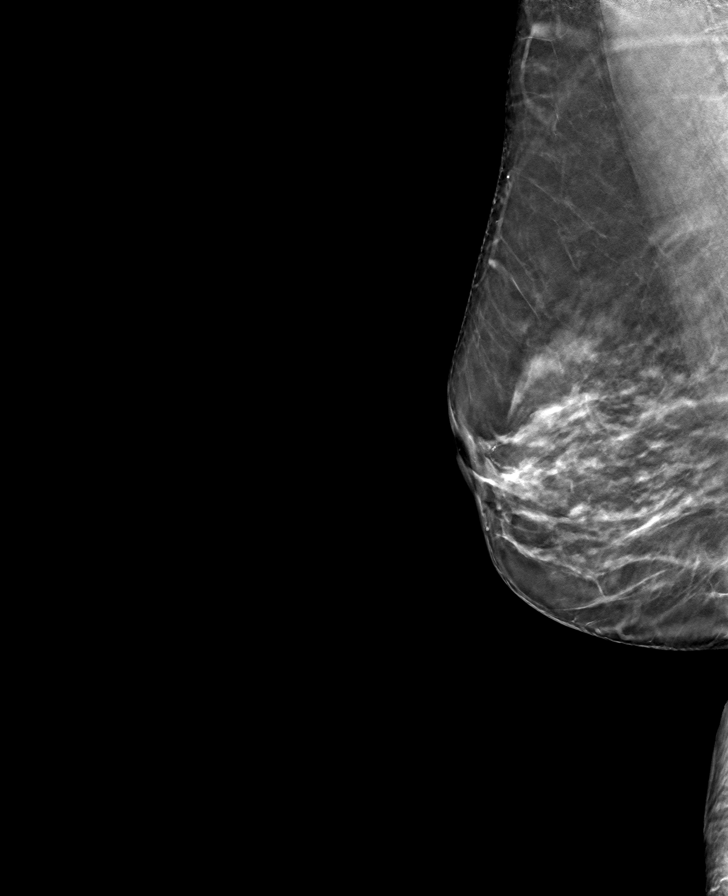

[8 of 24 positions shown; findings below may reference images not displayed]

ACR Breast Density Category c: The breast tissue is heterogeneously
dense, which may obscure small masses.
FINDINGS: There are no findings suspicious for malignancy.
IMPRESSION: No mammographic evidence of malignancy. A result letter of this
screening mammogram will be mailed directly to the patient.

RECOMMENDATION:
Screening mammogram in one year. (Code:Q3-W-BC3)

BI-RADS CATEGORY  1: Negative.

## 2022-07-05 DIAGNOSIS — R051 Acute cough: Secondary | ICD-10-CM | POA: Diagnosis not present

## 2022-07-22 ENCOUNTER — Encounter: Payer: Self-pay | Admitting: Family Medicine

## 2022-07-22 ENCOUNTER — Ambulatory Visit (INDEPENDENT_AMBULATORY_CARE_PROVIDER_SITE_OTHER): Payer: Medicare Other | Admitting: Family Medicine

## 2022-07-22 VITALS — BP 150/90 | HR 122 | Temp 98.7°F | Ht 61.0 in | Wt 131.4 lb

## 2022-07-22 DIAGNOSIS — R052 Subacute cough: Secondary | ICD-10-CM | POA: Diagnosis not present

## 2022-07-22 DIAGNOSIS — Z23 Encounter for immunization: Secondary | ICD-10-CM

## 2022-07-22 MED ORDER — AZITHROMYCIN 250 MG PO TABS: ORAL_TABLET | ORAL | 0 refills | Status: AC

## 2022-07-22 NOTE — Patient Instructions (Signed)
Welcome to [facility]! It was a pleasure meeting you today.  As discussed, Please schedule a 2-3 month follow up visit today.  [email or web address]    order 4 more covid tests.  Nasal saline- a lot.  Possibly mucinex.   Honey and lemon.   Sending antibiotic to pharmacy  If worse, not improving,  call-chest x-ray  PLEASE NOTE:  If you had any LAB tests please let us know if you have not heard back within a few days. You may see your results on MyChart before we have a chance to review them but we will give you a call once they are reviewed by [place]. If we ordered any REFERRALS today, please let us know if you have not heard from their office within the next week.  Let us know through MyChart if you are needing REFILLS, or have your pharmacy send [place] the request. You can also use MyChart to communicate with me or any office staff.  Please try these tips to maintain a healthy lifestyle:  Eat most of your calories during the day when you are active. Eliminate processed foods including packaged sweets (pies, cakes, cookies), reduce intake of potatoes, white bread, white pasta, and white rice. Look for whole grain options, oat flour or almond flour.  Each meal should contain half fruits/vegetables, one quarter protein, and one quarter carbs (no bigger than a computer mouse).  Cut down on sweet beverages. This includes juice, soda, and sweet tea. Also watch fruit intake, though this is a healthier sweet option, it still contains natural sugar! Limit to 3 servings daily.  Drink at least 1 glass of water with each meal and aim for at least 8 glasses per day  Exercise at least 150 minutes every week.

## 2022-07-22 NOTE — Progress Notes (Signed)
New Patient Office Visit  Subjective:  Patient ID: Cassandra Knight, female    DOB: 10-Feb-1951  Age: 71 y.o. MRN: [id number]  CC:  Chief Complaint  Patient presents with   Establish Care    Need new pcp    Cough    Cough x 3 weeks    HPI [name] presents for new pt Cough x 4 wks.  Dry and hard first 2 wks.  Past 2 wks-more from something draining back of throat.  Occ productive.  Taking zycam congestion.   Went to [facility] 2 wks ago and given tessalon perles.  No runny nose, but dripping down throat.   Some HA.  No face pain.  No f/c.  No sob.   No v/d.   No heartburn. Covid neg.   Past Medical History:  Diagnosis Date   Allergy    Arthritis    Melanoma ([place])    R upper arm   Osteopenia    Osteoporosis     Past Surgical History:  Procedure Laterality Date   BREAST EXCISIONAL BIOPSY Right    x 2, 2001 and 2003-took tomoxifen for 5 yrs and fosamax   broken 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   EYE SURGERY Bilateral 2006   lasix   MELANOMA EXCISION Right 2018   right upper arm   SALPINGOOPHORECTOMY Right 2002   TUBAL LIGATION  1984    Family History  Problem Relation Age of Onset   Hypertension Mother    Cancer Mother    Arthritis Mother    Breast cancer Mother    Cancer Father    Early death Father    Colon cancer Father    Asthma Sister    Arthritis Sister    Arthritis Brother    Early death Maternal Grandfather    Heart attack Maternal Grandfather     Social History   Socioeconomic History   Marital status: Married    Spouse name: Not on file   Number of children: 3   Years of education: Not on file   Highest education level: Not on file  Occupational History   Not on file  Tobacco Use   Smoking status: Former    Types: Cigarettes    Quit date: 06/29/1974    Years since quitting: 48.0   Smokeless tobacco: Never  Vaping Use   Vaping Use: Never used  Substance and Sexual Activity   Alcohol use: Yes    Alcohol/week: 3.0 -  5.0 standard drinks of alcohol    Types: 3 - 5 Glasses of wine per week    Comment: 1/d not daily   Drug use: Not Currently    Types: Marijuana    Comment: used in the past   Sexual activity: Not Currently  Other Topics Concern   Not on file  Social History Narrative   3 grands   Retired Therapist, art   Social Determinants of Radio broadcast assistant Strain: Not on file  Food Insecurity: Not on file  Transportation Needs: Not on file  Physical Activity: Not on file  Stress: Not on file  Social Connections: Not on file  Intimate Partner Violence: Not on file    ROS  ROS: Gen: no fever, chills  Skin: no rash, itching ENT: no ear pain, ear drainage, nasal congestion, rhinorrhea, sinus pressure, sore throat Eyes: no blurry vision, double vision Resp: HPI CV: no CP, palpitations, LE edema,  when goes to  doctor, bp goes up and pulse.  [facility] 124[facility]74.   HR always 80-90.  GI: no heartburn, n/v/d/c, abd pain GU: no dysuria, urgency, frequency, hematuria MSK: no joint pain, myalgias, back pain Neuro: no dizziness, headache, weakness, vertigo Psych: no depression, anxiety, insomnia, SI   Objective:   Today's Vitals: BP (!) 150/90   Pulse (!) 122   Temp 98.7 F (37.1 C) (Temporal)   Ht '5\' 1"'$  (1.549 m)   Wt 131 lb 6 oz (59.6 kg)   SpO2 98%   BMI 24.82 kg/m   Physical Exam  Gen: WDWN NAD HEENT: NCAT, conjunctiva not injected, sclera nonicteric TM WNL B, OP moist, no exudates .  Sinuses nt.  Some congestion NECK:  supple, no thyromegaly, no nodes, no carotid bruits CARDIAC: tachy RRR, S1S2+, no murmur. DP 2+B LUNGS: CTAB. No wheezes ABDOMEN:  BS+, soft, NTND, No HSM, no masses EXT:  no edema MSK: no gross abnormalities.  NEURO: A&O x3.  CN II-XII intact.  PSYCH: normal mood. Good eye contact   Assessment & Plan:   Problem List Items Addressed This Visit   None Visit Diagnoses     Subacute cough    -  Primary      Subacute cough-suspect URI and  from drainage.  Mucinex, honey, saline.  Zpk.  Worse, consider cxr.    Sch for cpx.  Outpatient Encounter Medications as of 07/22/2022  Medication Sig   benzonatate (TESSALON) 100 MG capsule Take 100 mg by mouth 3 (three) times daily as needed.   Calcium-Phosphorus-Vitamin D (CALCIUM GUMMIES PO) Take 2 tablets by mouth daily. VitaFusion Calcium   Homeopathic Products (ZICAM ALLERGY RELIEF NA) Place 2 sprays into the nose as needed.   ibuprofen (ADVIL,MOTRIN) 600 MG tablet Take 1 tablet (600 mg total) by mouth every 8 (eight) hours as needed.   Misc Natural Products (SAMBUCUS ELDERBERRY IMMUNE) CHEW Chew 1 tablet by mouth daily.   Multiple Vitamin (MULTIVITAMIN ADULT PO) Take 2 tablets by mouth daily. 2 gummies by mouth daily, VitaFusion Multivitamin   UNABLE TO FIND Take 20 g by mouth daily. Med Name: Vital Proteins Collagen Peptides 4 tbsp daily   [DISCONTINUED] Multiple Vitamin (MULTIVITAMIN WITH MINERALS) TABS tablet Take 1 tablet by mouth daily.   [DISCONTINUED] ondansetron (ZOFRAN ODT) 8 MG disintegrating tablet Take 1 tablet (8 mg total) by mouth every 8 (eight) hours as needed for nausea or vomiting.   No facility-administered encounter medications on file as of 07/22/2022.    Follow-up: No follow-ups on file.   Wellington Hampshire, MD

## 2022-08-03 DIAGNOSIS — Z86018 Personal history of other benign neoplasm: Secondary | ICD-10-CM | POA: Diagnosis not present

## 2022-08-03 DIAGNOSIS — L821 Other seborrheic keratosis: Secondary | ICD-10-CM | POA: Diagnosis not present

## 2022-08-03 DIAGNOSIS — L814 Other melanin hyperpigmentation: Secondary | ICD-10-CM | POA: Diagnosis not present

## 2022-08-03 DIAGNOSIS — Z85820 Personal history of malignant melanoma of skin: Secondary | ICD-10-CM | POA: Diagnosis not present

## 2022-08-16 ENCOUNTER — Other Ambulatory Visit: Payer: Self-pay | Admitting: Obstetrics and Gynecology

## 2022-08-16 DIAGNOSIS — Z1231 Encounter for screening mammogram for malignant neoplasm of breast: Secondary | ICD-10-CM

## 2022-09-20 ENCOUNTER — Ambulatory Visit (INDEPENDENT_AMBULATORY_CARE_PROVIDER_SITE_OTHER): Payer: Medicare Other

## 2022-09-20 VITALS — Wt 131.0 lb

## 2022-09-20 DIAGNOSIS — Z0000 Encounter for general adult medical examination without abnormal findings: Secondary | ICD-10-CM | POA: Diagnosis not present

## 2022-09-20 NOTE — Progress Notes (Signed)
I connected with  Collier Flowers on 09/20/22 by a audio enabled telemedicine application and verified that I am speaking with the correct person using two identifiers.  Patient Location: Home  Provider Location: Office/Clinic  I discussed the limitations of evaluation and management by telemedicine. The patient expressed understanding and agreed to proceed.   Subjective:   Cassandra Knight is a 71 y.o. female who presents for an Initial Medicare Annual Wellness Visit.  Review of Systems     Cardiac Risk Factors include: advanced age (>35mn, >>67women)     Objective:    Today's Vitals   09/20/22 0839  Weight: 131 lb (59.4 kg)   Body mass index is 24.75 kg/m.     09/20/2022    8:43 AM 09/28/2015    8:08 PM  Advanced Directives  Does Patient Have a Medical Advance Directive? Yes No  Type of AParamedicof APrayLiving will   Copy of HDennisonin Chart? No - copy requested   Would patient like information on creating a medical advance directive?  No - patient declined information    Current Medications (verified) Outpatient Encounter Medications as of 09/20/2022  Medication Sig   Calcium-Phosphorus-Vitamin D (CALCIUM GUMMIES PO) Take 2 tablets by mouth daily. VitaFusion Calcium   Homeopathic Products (ZICAM ALLERGY RELIEF NA) Place 2 sprays into the nose as needed.   ibuprofen (ADVIL,MOTRIN) 600 MG tablet Take 1 tablet (600 mg total) by mouth every 8 (eight) hours as needed.   Misc Natural Products (SAMBUCUS ELDERBERRY IMMUNE) CHEW Chew 1 tablet by mouth daily.   Multiple Vitamin (MULTIVITAMIN ADULT PO) Take 2 tablets by mouth daily. 2 gummies by mouth daily, VitaFusion Multivitamin   UNABLE TO FIND Take 20 g by mouth daily. Med Name: Vital Proteins Collagen Peptides 4 tbsp daily   [DISCONTINUED] benzonatate (TESSALON) 100 MG capsule Take 100 mg by mouth 3 (three) times daily as needed.   No facility-administered encounter  medications on file as of 09/20/2022.    Allergies (verified) Patient has no known allergies.   History: Past Medical History:  Diagnosis Date   Allergy    Arthritis    Melanoma ([place]    R upper arm   Osteopenia    Osteoporosis    Past Surgical History:  Procedure Laterality Date   BREAST EXCISIONAL BIOPSY Right    x 2, 2001 and 2003-took tomoxifen for 5 yrs and fosamax   broken 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   EYE SURGERY Bilateral 2006   lasix   MELANOMA EXCISION Right 2018   right upper arm   SALPINGOOPHORECTOMY Right 2002   TUBAL LIGATION  1984   Family History  Problem Relation Age of Onset   Hypertension Mother    Cancer Mother    Arthritis Mother    Breast cancer Mother    Cancer Father    Early death Father    Colon cancer Father    Asthma Sister    Arthritis Sister    Arthritis Brother    Early death Maternal Grandfather    Heart attack Maternal Grandfather    Social History   Socioeconomic History   Marital status: Married    Spouse name: Not on file   Number of children: 3   Years of education: Not on file   Highest education level: Not on file  Occupational History   Not on file  Tobacco Use   Smoking status: Former  Types: Cigarettes    Quit date: 06/29/1974    Years since quitting: 48.2   Smokeless tobacco: Never  Vaping Use   Vaping Use: Never used  Substance and Sexual Activity   Alcohol use: Yes    Alcohol/week: 3.0 - 5.0 standard drinks of alcohol    Types: 3 - 5 Glasses of wine per week    Comment: 1/d not daily   Drug use: Not Currently    Types: Marijuana    Comment: used in the past   Sexual activity: Not Currently  Other Topics Concern   Not on file  Social History Narrative   3 grands   Retired Therapist, art   Social Determinants of Health   Financial Resource Strain: Low Risk  (09/20/2022)   Overall Financial Resource Strain (CARDIA)    Difficulty of Paying Living Expenses: Not hard at all   Food Insecurity: No Food Insecurity (09/20/2022)   Hunger Vital Sign    Worried About Running Out of Food in the Last Year: Never true    Ran Out of Food in the Last Year: Never true  Transportation Needs: No Transportation Needs (09/20/2022)   PRAPARE - Hydrologist (Medical): No    Lack of Transportation (Non-Medical): No  Physical Activity: Sufficiently Active (09/20/2022)   Exercise Vital Sign    Days of Exercise per Week: 5 days    Minutes of Exercise per Session: 30 min  Stress: No Stress Concern Present (09/20/2022)   [place]    Feeling of Stress : Not at all  Social Connections: Moderately Isolated (09/20/2022)   Social Connection and Isolation Panel [NHANES]    Frequency of Communication with Friends and Family: More than three times a week    Frequency of Social Gatherings with Friends and Family: More than three times a week    Attends Religious Services: Never    Marine scientist or Organizations: No    Attends Music therapist: Never    Marital Status: Married    Tobacco Counseling Counseling given: Not Answered   Clinical Intake:  Pre-visit preparation completed: Yes  Pain : No/denies pain     BMI - recorded: 24.75 Nutritional Status: BMI of 19-24  Normal Nutritional Risks: None Diabetes: No  How often do you need to have someone help you when you read instructions, pamphlets, or other written materials from your doctor or pharmacy?: 1 - Never  Diabetic?no  Interpreter Needed?: No  Information entered by :: Charlott Rakes, LPN   Activities of Daily Living    09/20/2022    8:43 AM  In your present state of health, do you have any difficulty performing the following activities:  Hearing? 0  Vision? 0  Difficulty concentrating or making decisions? 0  Walking or climbing stairs? 0  Dressing or bathing? 0  Doing errands, shopping?  0  Preparing Food and eating ? N  Using the Toilet? N  In the past six months, have you accidently leaked urine? N  Do you have problems with loss of bowel control? N  Managing your Medications? N  Managing your Finances? N  Housekeeping or managing your Housekeeping? N    Patient Care Team: Tawnya Crook, MD as PCP - General (Family Medicine)  Indicate any recent Medical Services you may have received from other than [facility] providers in the past year (date may be approximate).     Assessment:  This is a routine wellness examination for Abigial.  Hearing/Vision screen Hearing Screening - Comments:: Pt denies any haring issues  Vision Screening - Comments:: Pt encouraged to make an appt with Dr Oswaldo Conroy   Dietary issues and exercise activities discussed: Current Exercise Habits: Home exercise routine, Type of exercise: walking;Other - see comments, Time (Minutes): 30, Frequency (Times/Week): 5, Weekly Exercise (Minutes/Week): 150   Goals Addressed             This Visit's Progress    Patient Stated       Stay healthy and active        Depression Screen    09/20/2022    8:41 AM 07/22/2022    2:10 PM  PHQ 2/9 Scores  PHQ - 2 Score 0 0  PHQ- 9 Score 0 1    Fall Risk    09/20/2022    8:43 AM 07/22/2022    2:01 PM  Fall Risk   Falls in the past year? 0 0  Number falls in past yr: 0 0  Injury with Fall? 0 0  Risk for fall due to :  No Fall Risks  Follow up Falls prevention discussed Falls evaluation completed    [place]:  Any stairs in or around the home? Yes  If so, are there any without handrails? No  Home free of loose throw rugs in walkways, pet beds, electrical cords, etc? Yes  Adequate lighting in your home to reduce risk of falls? Yes   ASSISTIVE DEVICES UTILIZED TO PREVENT FALLS:  Life alert? No  Use of a cane, walker or w/c? No  Grab bars in the bathroom? Yes  Shower chair or bench in shower? No  Elevated  toilet seat or a handicapped toilet? No   TIMED UP AND GO:  Was the test performed? No .   Cognitive Function:        09/20/2022    8:44 AM  6CIT Screen  What Year? 0 points  What month? 0 points  What time? 0 points  Count back from 20 0 points  Months in reverse 0 points  Repeat phrase 0 points  Total Score 0 points    Immunizations Immunization History  Administered Date(s) Administered   Fluad Quad(high Dose 65+) 07/08/2019, 07/22/2022   Influenza,inj,quad, With Preservative 09/28/2015   Influenza,trivalent, recombinat, inj, PF 07/31/2014   Influenza-Unspecified 08/11/2016, 08/09/2017, 08/13/2018   PFIZER(Purple Top)SARS-COV-2 Vaccination 11/16/2019, 12/07/2019, 07/28/2020   Pfizer Covid-19 Vaccine Bivalent Booster 60yr & up 07/30/2021   Pneumococcal Conjugate-13 07/08/2019   Pneumococcal Polysaccharide-23 07/07/2020   Zoster Recombinat (Shingrix) 08/02/2019, 11/01/2019   Zoster, Live 08/28/2015    TDAP status: Due, Education has been provided regarding the importance of this vaccine. Advised may receive this vaccine at local pharmacy or Health Dept. Aware to provide a copy of the vaccination record if obtained from local pharmacy or Health Dept. Verbalized acceptance and understanding.  Flu Vaccine status: Up to date  Pneumococcal vaccine status: Up to date  Covid-19 vaccine status: Completed vaccines  Qualifies for Shingles Vaccine? Yes   Zostavax completed Yes   Shingrix Completed?: Yes  Screening Tests Health Maintenance  Topic Date Due   Hepatitis C Screening  Never done   COLONOSCOPY (Pts 45-480yrInsurance coverage will need to be confirmed)  07/13/2017   COVID-19 Vaccine (5 - 2023-24 season) 06/24/2022   Medicare Annual Wellness (AWV)  09/21/2023   MAMMOGRAM  09/24/2023   Pneumonia Vaccine 6570Years old  Completed   INFLUENZA VACCINE  Completed   DEXA SCAN  Completed   Zoster Vaccines- Shingrix  Completed   HPV VACCINES  Aged Out    Health  Maintenance  Health Maintenance Due  Topic Date Due   Hepatitis C Screening  Never done   COLONOSCOPY (Pts 45-35yr Insurance coverage will need to be confirmed)  07/13/2017   COVID-19 Vaccine (5 - 2023-24 season) 06/24/2022    Colorectal cancer screening: Type of screening: Colonoscopy. Completed 07/13/12. Repeat every 5 years pt stated she wants discuss at appt 09/2022  Mammogram status: Completed 09/23/21. Repeat every year  Bone density 12/29/16  Additional Screening:  Hepatitis C Screening: does qualify  Vision Screening: Recommended annual ophthalmology exams for early detection of glaucoma and other disorders of the eye. Is the patient up to date with their annual eye exam?  No  Who is the provider or what is the name of the office in which the patient attends annual eye exams? Dr BOswaldo Conroy If pt is not established with a provider, would they like to be referred to a provider to establish care? No .   Dental Screening: Recommended annual dental exams for proper oral hygiene  Community Resource Referral / Chronic Care Management: CRR required this visit?  No   CCM required this visit?  No      Plan:     I have personally reviewed and noted the following in the patient's chart:   Medical and social history Use of alcohol, tobacco or illicit drugs  Current medications and supplements including opioid prescriptions. Patient is not currently taking opioid prescriptions. Functional ability and status Nutritional status Physical activity Advanced directives List of other physicians Hospitalizations, surgeries, and ER visits in previous 12 months Vitals Screenings to include cognitive, depression, and falls Referrals and appointments  In addition, I have reviewed and discussed with patient certain preventive protocols, quality metrics, and best practice recommendations. A written personalized care plan for preventive services as well as general preventive health  recommendations were provided to patient.     TWillette Brace LPN   [id number]  Nurse Notes: Pt wants to discuss colonoscopy at time of physical.

## 2022-09-20 NOTE — Patient Instructions (Signed)
Cassandra Knight , Thank you for taking time to come for your Medicare Wellness Visit. I appreciate your ongoing commitment to your health goals. Please review the following plan we discussed and let me know if I can assist you in the future.   These are the goals we discussed:  Goals      Patient Stated     Stay healthy and active         This is a list of the screening recommended for you and due dates:  Health Maintenance  Topic Date Due   Hepatitis C Screening: USPSTF Recommendation to screen - Ages 47-79 yo.  Never done   Colon Cancer Screening  07/13/2017   COVID-19 Vaccine (5 - 2023-24 season) 06/24/2022   Medicare Annual Wellness Visit  09/21/2023   Mammogram  09/24/2023   Pneumonia Vaccine  Completed   Flu Shot  Completed   DEXA scan (bone density measurement)  Completed   Zoster (Shingles) Vaccine  Completed   HPV Vaccine  Aged Out    Advanced directives: Please bring a copy of your health care power of attorney and living will to the office at your convenience.  Conditions/risks identified: stay healthy and active   Next appointment: Follow up in one year for your annual wellness visit    Preventive Care 65 Years and Older, Female Preventive care refers to lifestyle choices and visits with your health care provider that can promote health and wellness. What does preventive care include? A yearly physical exam. This is also called an annual well check. Dental exams once or twice a year. Routine eye exams. Ask your health care provider how often you should have your eyes checked. Personal lifestyle choices, including: Daily care of your teeth and gums. Regular physical activity. Eating a healthy diet. Avoiding tobacco and drug use. Limiting alcohol use. Practicing safe sex. Taking low-dose aspirin every day. Taking vitamin and mineral supplements as recommended by your health care provider. What happens during an annual well check? The services and screenings done  by your health care provider during your annual well check will depend on your age, overall health, lifestyle risk factors, and family history of disease. Counseling  Your health care provider may ask you questions about your: Alcohol use. Tobacco use. Drug use. Emotional well-being. Home and relationship well-being. Sexual activity. Eating habits. History of falls. Memory and ability to understand (cognition). Work and work Statistician. Reproductive health. Screening  You may have the following tests or measurements: Height, weight, and BMI. Blood pressure. Lipid and cholesterol levels. These may be checked every 5 years, or more frequently if you are over 48 years old. Skin check. Lung cancer screening. You may have this screening every year starting at age 28 if you have a 30-pack-year history of smoking and currently smoke or have quit within the past 15 years. Fecal occult blood test (FOBT) of the stool. You may have this test every year starting at age 56. Flexible sigmoidoscopy or colonoscopy. You may have a sigmoidoscopy every 5 years or a colonoscopy every 10 years starting at age 18. Hepatitis C blood test. Hepatitis B blood test. Sexually transmitted disease (STD) testing. Diabetes screening. This is done by checking your blood sugar (glucose) after you have not eaten for a while (fasting). You may have this done every 1-3 years. Bone density scan. This is done to screen for osteoporosis. You may have this done starting at age 5. Mammogram. This may be done every 1-2 years. Talk  to your health care provider about how often you should have regular mammograms. Talk with your health care provider about your test results, treatment options, and if necessary, the need for more tests. Vaccines  Your health care provider may recommend certain vaccines, such as: Influenza vaccine. This is recommended every year. Tetanus, diphtheria, and acellular pertussis (Tdap, Td) vaccine. You  may need a Td booster every 10 years. Zoster vaccine. You may need this after age 35. Pneumococcal 13-valent conjugate (PCV13) vaccine. One dose is recommended after age 18. Pneumococcal polysaccharide (PPSV23) vaccine. One dose is recommended after age 71. Talk to your health care provider about which screenings and vaccines you need and how often you need them. This information is not intended to replace advice given to you by your health care provider. Make sure you discuss any questions you have with your health care provider. Document Released: 11/06/2015 Document Revised: 06/29/2016 Document Reviewed: 08/11/2015 Elsevier Interactive Patient Education  2017 [employer] Prevention in the Home Falls can cause injuries. They can happen to people of all ages. There are many things you can do to make your home safe and to help prevent falls. What can I do on the outside of my home? Regularly fix the edges of walkways and driveways and fix any cracks. Remove anything that might make you trip as you walk through a door, such as a raised step or threshold. Trim any bushes or trees on the path to your home. Use bright outdoor lighting. Clear any walking paths of anything that might make someone trip, such as rocks or tools. Regularly check to see if handrails are loose or broken. Make sure that both sides of any steps have handrails. Any raised decks and porches should have guardrails on the edges. Have any leaves, snow, or ice cleared regularly. Use sand or salt on walking paths during winter. Clean up any spills in your garage right away. This includes oil or grease spills. What can I do in the bathroom? Use night lights. Install grab bars by the toilet and in the tub and shower. Do not use towel bars as grab bars. Use non-skid mats or decals in the tub or shower. If you need to sit down in the shower, use a plastic, non-slip stool. Keep the floor dry. Clean up any water that spills  on the floor as soon as it happens. Remove soap buildup in the tub or shower regularly. Attach bath mats securely with double-sided non-slip rug tape. Do not have throw rugs and other things on the floor that can make you trip. What can I do in the bedroom? Use night lights. Make sure that you have a light by your bed that is easy to reach. Do not use any sheets or blankets that are too big for your bed. They should not hang down onto the floor. Have a firm chair that has side arms. You can use this for support while you get dressed. Do not have throw rugs and other things on the floor that can make you trip. What can I do in the kitchen? Clean up any spills right away. Avoid walking on wet floors. Keep items that you use a lot in easy-to-reach places. If you need to reach something above you, use a strong step stool that has a grab bar. Keep electrical cords out of the way. Do not use floor polish or wax that makes floors slippery. If you must use wax, use non-skid floor wax. Do  not have throw rugs and other things on the floor that can make you trip. What can I do with my stairs? Do not leave any items on the stairs. Make sure that there are handrails on both sides of the stairs and use them. Fix handrails that are broken or loose. Make sure that handrails are as long as the stairways. Check any carpeting to make sure that it is firmly attached to the stairs. Fix any carpet that is loose or worn. Avoid having throw rugs at the top or bottom of the stairs. If you do have throw rugs, attach them to the floor with carpet tape. Make sure that you have a light switch at the top of the stairs and the bottom of the stairs. If you do not have them, ask someone to add them for you. What else can I do to help prevent falls? Wear shoes that: Do not have high heels. Have rubber bottoms. Are comfortable and fit you well. Are closed at the toe. Do not wear sandals. If you use a stepladder: Make  sure that it is fully opened. Do not climb a closed stepladder. Make sure that both sides of the stepladder are locked into place. Ask someone to hold it for you, if possible. Clearly mark and make sure that you can see: Any grab bars or handrails. First and last steps. Where the edge of each step is. Use tools that help you move around (mobility aids) if they are needed. These include: Canes. Walkers. Scooters. Crutches. Turn on the lights when you go into a dark area. Replace any light bulbs as soon as they burn out. Set up your furniture so you have a clear path. Avoid moving your furniture around. If any of your floors are uneven, fix them. If there are any pets around you, be aware of where they are. Review your medicines with your doctor. Some medicines can make you feel dizzy. This can increase your chance of falling. Ask your doctor what other things that you can do to help prevent falls. This information is not intended to replace advice given to you by your health care provider. Make sure you discuss any questions you have with your health care provider. Document Released: 08/06/2009 Document Revised: 03/17/2016 Document Reviewed: 11/14/2014 Elsevier Interactive Patient Education  2017 [employer].

## 2022-10-11 ENCOUNTER — Ambulatory Visit: Payer: Medicare Other

## 2022-10-19 ENCOUNTER — Ambulatory Visit (INDEPENDENT_AMBULATORY_CARE_PROVIDER_SITE_OTHER): Payer: Medicare Other | Admitting: Family Medicine

## 2022-10-19 ENCOUNTER — Encounter: Payer: Self-pay | Admitting: Family Medicine

## 2022-10-19 VITALS — BP 168/82 | HR 50 | Temp 98.1°F | Ht 61.0 in | Wt 130.5 lb

## 2022-10-19 DIAGNOSIS — Z1159 Encounter for screening for other viral diseases: Secondary | ICD-10-CM

## 2022-10-19 DIAGNOSIS — I10 Essential (primary) hypertension: Secondary | ICD-10-CM | POA: Insufficient documentation

## 2022-10-19 DIAGNOSIS — R0989 Other specified symptoms and signs involving the circulatory and respiratory systems: Secondary | ICD-10-CM

## 2022-10-19 DIAGNOSIS — M816 Localized osteoporosis [Lequesne]: Secondary | ICD-10-CM

## 2022-10-19 DIAGNOSIS — Z1211 Encounter for screening for malignant neoplasm of colon: Secondary | ICD-10-CM

## 2022-10-19 DIAGNOSIS — Z0000 Encounter for general adult medical examination without abnormal findings: Secondary | ICD-10-CM

## 2022-10-19 LAB — COMPREHENSIVE METABOLIC PANEL
ALT: 13 U/L (ref 0–35)
AST: 17 U/L (ref 0–37)
Albumin: 4.6 g/dL (ref 3.5–5.2)
Alkaline Phosphatase: 70 U/L (ref 39–117)
BUN: 20 mg/dL (ref 6–23)
CO2: 27 mEq/L (ref 19–32)
Calcium: 9.2 mg/dL (ref 8.4–10.5)
Chloride: 107 mEq/L (ref 96–112)
Creatinine, Ser: 0.68 mg/dL (ref 0.40–1.20)
GFR: 89.03 mL/min (ref >60.00)
Glucose, Bld: 101 mg/dL — ABNORMAL HIGH (ref 70–99)
Potassium: 4.1 mEq/L (ref 3.5–5.1)
Sodium: 142 mEq/L (ref 135–145)
Total Bilirubin: 0.5 mg/dL (ref 0.2–1.2)
Total Protein: 7 g/dL (ref 6.0–8.3)

## 2022-10-19 LAB — CBC WITH DIFFERENTIAL/PLATELET
Basophils Absolute: 0 10*3/uL (ref 0.0–0.1)
Basophils Relative: 0.8 % (ref 0.0–3.0)
Eosinophils Absolute: 0.1 10*3/uL (ref 0.0–0.7)
Eosinophils Relative: 1.1 % (ref 0.0–5.0)
HCT: 42.4 % (ref 36.0–46.0)
Hemoglobin: 14.6 g/dL (ref 12.0–15.0)
Lymphocytes Relative: 28.1 % (ref 12.0–46.0)
Lymphs Abs: 1.7 10*3/uL (ref 0.7–4.0)
MCHC: 34.4 g/dL (ref 30.0–36.0)
MCV: 99 fl (ref 78.0–100.0)
Monocytes Absolute: 0.4 10*3/uL (ref 0.1–1.0)
Monocytes Relative: 7.6 % (ref 3.0–12.0)
Neutro Abs: 3.7 10*3/uL (ref 1.4–7.7)
Neutrophils Relative %: 62.4 % (ref 43.0–77.0)
Platelets: 249 10*3/uL (ref 150.0–400.0)
RBC: 4.29 Mil/uL (ref 3.87–5.11)
RDW: 12.8 % (ref 11.5–15.5)
WBC: 5.9 10*3/uL (ref 4.0–10.5)

## 2022-10-19 LAB — MICROALBUMIN / CREATININE URINE RATIO
Creatinine,U: 66.8 mg/dL
Microalb Creat Ratio: 1 mg/g (ref 0.0–30.0)
Microalb, Ur: <0.7 mg/dL (ref 0.0–1.9)

## 2022-10-19 LAB — LIPID PANEL
Cholesterol: 203 mg/dL — ABNORMAL HIGH (ref 0–200)
HDL: 64.7 mg/dL (ref >39.00)
LDL Cholesterol: 119 mg/dL — ABNORMAL HIGH (ref 0–99)
NonHDL: 137.83
Total CHOL/HDL Ratio: 3
Triglycerides: 93 mg/dL (ref 0.0–149.0)
VLDL: 18.6 mg/dL (ref 0.0–40.0)

## 2022-10-19 LAB — HEMOGLOBIN A1C: Hgb A1c MFr Bld: 5.3 % (ref 4.6–6.5)

## 2022-10-19 LAB — VITAMIN D 25 HYDROXY (VIT D DEFICIENCY, FRACTURES): VITD: 54.33 ng/mL (ref 30.00–100.00)

## 2022-10-19 LAB — TSH: TSH: 0.86 u[IU]/mL (ref 0.35–5.50)

## 2022-10-19 MED ORDER — LOSARTAN POTASSIUM 25 MG PO TABS: 25.0000 mg | ORAL_TABLET | Freq: Every day | ORAL | 1 refills | Status: DC

## 2022-10-19 NOTE — Progress Notes (Signed)
Phone [phone]   Subjective:   Patient is a 71 y.o. female presenting for annual physical.    Chief Complaint  Patient presents with   Annual Exam    CPE Fasting for blood work   Annual-some exercise.  Has some osteoporosis.  L hip can be achey and stiff Bp at home 130-145/75-90.  Did fit test-neg on 08/17/22.  Cscope Sept 2013-repeat 5 d/t Dad w/colon ca  See problem oriented charting- ROS- ROS: Gen: no fever, chills  Skin: no rash, itching.  Sees derm yearly around Oct for melanoma f/u  ENT: no ear pain, ear drainage, nasal congestion, rhinorrhea, sinus pressure, sore throat Eyes: no blurry vision, double vision Resp: no cough, wheeze,SOB CV: no CP, palpitations, LE edema,  GI: no heartburn, n/v/d/c, abd pain GU: no dysuria, urgency, frequency, hematuria MSK: OA Neuro: no dizziness, headache, weakness, vertigo Psych: no depression, anxiety, insomnia, SI   The following were reviewed and entered/updated in epic: Past Medical History:  Diagnosis Date   Allergy    Arthritis    Melanoma ([place])    R upper arm   Osteopenia    Osteoporosis    Patient Active Problem List   Diagnosis Date Noted   Primary hypertension 10/19/2022   Localized osteoporosis without current pathological fracture 10/19/2022   Past Surgical History:  Procedure Laterality Date   BREAST EXCISIONAL BIOPSY Right    x 2, 2001 and 2003-took tomoxifen for 5 yrs and fosamax   broken 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   EYE SURGERY Bilateral 2006   lasix   MELANOMA EXCISION Right 2018   right upper arm   SALPINGOOPHORECTOMY Right 2002   TUBAL LIGATION  1984    Family History  Problem Relation Age of Onset   Hypertension Mother    Cancer Mother    Arthritis Mother    Breast cancer Mother    Cancer Father    Early death Father    Colon cancer Father    Asthma Sister    Arthritis Sister    Arthritis Brother    Early death Maternal Grandfather    Heart attack Maternal  Grandfather     Medications- reviewed and updated Current Outpatient Medications  Medication Sig Dispense Refill   Calcium-Phosphorus-Vitamin D (CALCIUM GUMMIES PO) Take 2 tablets by mouth daily. VitaFusion Calcium     Homeopathic Products (ZICAM ALLERGY RELIEF NA) Place 2 sprays into the nose as needed.     ibuprofen (ADVIL,MOTRIN) 600 MG tablet Take 1 tablet (600 mg total) by mouth every 8 (eight) hours as needed. 15 tablet 0   losartan (COZAAR) 25 MG tablet Take 1 tablet (25 mg total) by mouth daily. 30 tablet 1   Misc Natural Products (SAMBUCUS ELDERBERRY IMMUNE) CHEW Chew 1 tablet by mouth daily.     Multiple Vitamin (MULTIVITAMIN ADULT PO) Take 2 tablets by mouth daily. 2 gummies by mouth daily, VitaFusion Multivitamin     UNABLE TO FIND Take 20 g by mouth daily. Med Name: Vital Proteins Collagen Peptides 4 tbsp daily     No current facility-administered medications for this visit.    Allergies-reviewed and updated No Known Allergies  Social History   Social History Narrative   3 grands   Retired Therapist, art   Objective  Objective:  BP (!) 168/82 (BP Location: Left Arm, Patient Position: Sitting, Cuff Size: Normal)   Pulse (!) 50   Temp 98.1 F (36.7 C) (Temporal)   Ht '5\' 1"'$  (  1.549 m)   Wt 130 lb 8 oz (59.2 kg)   SpO2 98%   BMI 24.66 kg/m  Physical Exam  Gen: WDWN NAD HEENT: NCAT, conjunctiva not injected, sclera nonicteric TM WNL B, OP moist, no exudates  NECK:  supple, no thyromegaly, no nodes, + R carotid bruits CARDIAC: RRR, S1S2+, no murmur. DP 2+B LUNGS: CTAB. No wheezes ABDOMEN:  BS+, soft, NTND, No HSM, no masses EXT:  no edema MSK: no gross abnormalities. MS 5/5 all 4 NEURO: A&O x3.  CN II-XII intact.  PSYCH: normal mood. Good eye contact    Assessment and Plan   Health Maintenance counseling: 1. Anticipatory guidance: Patient counseled regarding regular dental exams q6 months, eye exams,  avoiding smoking and second hand smoke, limiting  alcohol to 1 beverage per day, no illicit drugs.   2. Risk factor reduction:  Advised patient of need for regular exercise and diet rich and fruits and vegetables to reduce risk of heart attack and stroke. Exercise- increase.  Wt Readings from Last 3 Encounters:  10/19/22 130 lb 8 oz (59.2 kg)  09/20/22 131 lb (59.4 kg)  07/22/22 131 lb 6 oz (59.6 kg)   3. Immunizations/screenings/ancillary studies Immunization History  Administered Date(s) Administered   COVID-19, mRNA, vaccine(Comirnaty)12 years and older 08/04/2022   Fluad Quad(high Dose 65+) 07/08/2019, 07/22/2022   Influenza,inj,quad, With Preservative 09/28/2015   Influenza,trivalent, recombinat, inj, PF 07/31/2014   Influenza-Unspecified 08/11/2016, 08/09/2017, 08/13/2018   PFIZER(Purple Top)SARS-COV-2 Vaccination 11/16/2019, 12/07/2019, 07/28/2020   Pfizer Covid-19 Vaccine Bivalent Booster 4yr & up 07/30/2021   Pneumococcal Conjugate-13 07/08/2019   Pneumococcal Polysaccharide-23 07/07/2020   Zoster Recombinat (Shingrix) 08/02/2019, 11/01/2019   Zoster, Live 08/28/2015   Health Maintenance Due  Topic Date Due   Hepatitis C Screening  Never done   DTaP/Tdap/Td (1 - Tdap) Never done   COLONOSCOPY (Pts 45-422yrInsurance coverage will need to be confirmed)  07/13/2017    4. Cervical cancer screening- will sch 5. Breast cancer screening-  mammogram pt sch 6. Colon cancer screening - ordered 7. Skin cancer screening- advised regular sunscreen use. Denies worrisome, changing, or new skin lesions.  8. Birth control/STD check- n/a 9. Osteoporosis screening- ordered 10. Smoking associated screening - non smoker  Problem List Items Addressed This Visit       Cardiovascular and Mediastinum   Primary hypertension   Relevant Medications   losartan (COZAAR) 25 MG tablet   Other Relevant Orders   Comprehensive metabolic panel   Hemoglobin A1c   Lipid panel   TSH   CBC with Differential/Platelet   Microalbumin / creatinine  urine ratio     Musculoskeletal and Integument   Localized osteoporosis without current pathological fracture   Relevant Orders   DG BONE DENSITY (DXA)   VITAMIN D 25 Hydroxy (Vit-D Deficiency, Fractures)   Other Visit Diagnoses     Wellness examination    -  Primary   Encounter for hepatitis C screening test for low risk patient       Relevant Orders   Hepatitis C antibody   Screen for colon cancer       Relevant Orders   Ambulatory referral to Gastroenterology   Right carotid bruit       Relevant Orders   [place] Duplex Bilateral     Wellness-anticipatory guidance.  Work on [employer] Check CBC,CMP,lipids,TSH, A1C.  F/u 1 yr   getting mamm in Feb, will call for dxa and pap. HTN-new dx.  Check cbc,cmp,tsh,A1c,lipids,urine microalb/creat.  Start losartan '25mg'$ .  Monitor bp's.  F/u 3 wks R carotid bruit-check doppler Osteoporosis-working on exercise.  Taking vit D ca-check D.  Check dxa.    Recommended follow up: 3 wkReturn in about 3 weeks (around 11/09/2022) for htn. Future Appointments  Date Time Provider [place]  12/05/2022  8:20 AM [facility] MM 2 [facility]  09/25/2023  8:30 AM [facility] HEALTH COACH [facility]    Lab/Order associations:+ fasting   ICD-10-CM   1. Wellness examination  Z00.00     2. Localized osteoporosis without current pathological fracture  M81.6 DG BONE DENSITY (DXA)    VITAMIN D 25 Hydroxy (Vit-D Deficiency, Fractures)    3. Primary hypertension  I10 Comprehensive metabolic panel    Hemoglobin A1c    Lipid panel    TSH    CBC with Differential/Platelet    Microalbumin / creatinine urine ratio    4. Encounter for hepatitis C screening test for low risk patient  Z11.59 Hepatitis C antibody    5. Screen for colon cancer  Z12.11 Ambulatory referral to Gastroenterology    6. Right carotid bruit  R09.89 US Carotid Duplex Bilateral      Meds ordered this encounter  Medications   losartan (COZAAR) 25 MG tablet    Sig: Take 1  tablet (25 mg total) by mouth daily.    Dispense:  30 tablet    Refill:  1    Wellington Hampshire, MD

## 2022-10-19 NOTE — Addendum Note (Signed)
Addended by: Doran Clay A on: 10/19/2022 10:05 AM   Modules accepted: Orders

## 2022-10-19 NOTE — Patient Instructions (Addendum)
It was very nice to see you today!  Happy New Year!  Need copies advanced directives, etc.   Monitor blood pressures  Call breast center to set up the bone denisty   PLEASE NOTE:  If you had any lab tests please let us know if you have not heard back within a few days. You may see your results on MyChart before we have a chance to review them but we will give you a call once they are reviewed by [place]. If we ordered any referrals today, please let us know if you have not heard from their office within the next week.   Please try these tips to maintain a healthy lifestyle:  Eat most of your calories during the day when you are active. Eliminate processed foods including packaged sweets (pies, cakes, cookies), reduce intake of potatoes, white bread, white pasta, and white rice. Look for whole grain options, oat flour or almond flour.  Each meal should contain half fruits/vegetables, one quarter protein, and one quarter carbs (no bigger than a computer mouse).  Cut down on sweet beverages. This includes juice, soda, and sweet tea. Also watch fruit intake, though this is a healthier sweet option, it still contains natural sugar! Limit to 3 servings daily.  Drink at least 1 glass of water with each meal and aim for at least 8 glasses per day  Exercise at least 150 minutes every week.

## 2022-10-20 LAB — HEPATITIS C ANTIBODY: Hepatitis C Ab: NONREACTIVE

## 2022-11-01 ENCOUNTER — Ambulatory Visit
Admission: RE | Admit: 2022-11-01 | Discharge: 2022-11-01 | Disposition: A | Discharge status: Home / Self Care | Payer: BLUE CROSS/BLUE SHIELD | Source: Ambulatory Visit | Attending: Family Medicine | Admitting: Family Medicine

## 2022-11-01 DIAGNOSIS — I6523 Occlusion and stenosis of bilateral carotid arteries: Secondary | ICD-10-CM | POA: Diagnosis not present

## 2022-11-01 DIAGNOSIS — R0989 Other specified symptoms and signs involving the circulatory and respiratory systems: Secondary | ICD-10-CM | POA: Diagnosis not present

## 2022-11-01 DIAGNOSIS — I771 Stricture of artery: Secondary | ICD-10-CM | POA: Diagnosis not present

## 2022-11-02 NOTE — Progress Notes (Signed)
No major blockages.  Artery is "tortuous" so probably why I heard the bruit.  Will disscuss more at appointment next week  if bp still elevated, increase cozaar to '50mg'$ 

## 2022-11-09 ENCOUNTER — Ambulatory Visit: Payer: Medicare Other | Admitting: Family Medicine

## 2022-11-10 ENCOUNTER — Other Ambulatory Visit: Payer: Self-pay | Admitting: Family Medicine

## 2022-11-18 ENCOUNTER — Ambulatory Visit (INDEPENDENT_AMBULATORY_CARE_PROVIDER_SITE_OTHER): Payer: Medicare Other | Admitting: Family Medicine

## 2022-11-18 ENCOUNTER — Encounter: Payer: Self-pay | Admitting: Family Medicine

## 2022-11-18 VITALS — BP 130/80 | HR 94 | Temp 98.5°F | Ht 61.0 in | Wt 130.4 lb

## 2022-11-18 DIAGNOSIS — I10 Essential (primary) hypertension: Secondary | ICD-10-CM

## 2022-11-18 LAB — BASIC METABOLIC PANEL
BUN: 20 mg/dL (ref 6–23)
CO2: 26 mEq/L (ref 19–32)
Calcium: 8.8 mg/dL (ref 8.4–10.5)
Chloride: 104 mEq/L (ref 96–112)
Creatinine, Ser: 0.65 mg/dL (ref 0.40–1.20)
GFR: 89.95 mL/min (ref >60.00)
Glucose, Bld: 103 mg/dL — ABNORMAL HIGH (ref 70–99)
Potassium: 4.2 mEq/L (ref 3.5–5.1)
Sodium: 139 mEq/L (ref 135–145)

## 2022-11-18 MED ORDER — LOSARTAN POTASSIUM 50 MG PO TABS: 50.0000 mg | ORAL_TABLET | Freq: Every day | ORAL | 1 refills | Status: DC

## 2022-11-18 NOTE — Patient Instructions (Signed)
Increase losartan to '50mg'$ .  Monitor blood pressures.  Let me know

## 2022-11-18 NOTE — Progress Notes (Signed)
   Subjective:     Patient ID: Cassandra Knight, female    DOB: 08/15/1951, 72 y.o.   MRN: [id number]  Chief Complaint  Patient presents with   Follow-up    3 week follow-up for HTN Not fasting     HPI  HTN-Pt is on losartan '25mg'$   Bp's running 132-152/75-90.  No ha/dizziness/cp/palp/edema/cough/sob  no problems w/med   cuff has been checked. HR usu high end-80's.  No symptom  Health Maintenance Due  Topic Date Due   DTaP/Tdap/Td (1 - Tdap) Never done   COLONOSCOPY (Pts 45-59yr Insurance coverage will need to be confirmed)  07/13/2017    Past Medical History:  Diagnosis Date   Allergy    Arthritis    [place]    R upper arm   Osteopenia    Osteoporosis     Past Surgical History:  Procedure Laterality Date   BREAST EXCISIONAL BIOPSY Right    x 2, 2001 and 2003-took tomoxifen for 5 yrs and fosamax   broken 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   EYE SURGERY Bilateral 2006   lasix   MELANOMA EXCISION Right 2018   right upper arm   SALPINGOOPHORECTOMY Right 2002   TUBAL LIGATION  1984    Outpatient Medications Prior to Visit  Medication Sig Dispense Refill   Calcium-Phosphorus-Vitamin D (CALCIUM GUMMIES PO) Take 2 tablets by mouth daily. VitaFusion Calcium     Homeopathic Products (ZICAM ALLERGY RELIEF NA) Place 2 sprays into the nose as needed.     ibuprofen (ADVIL,MOTRIN) 600 MG tablet Take 1 tablet (600 mg total) by mouth every 8 (eight) hours as needed. 15 tablet 0   losartan (COZAAR) 25 MG tablet Take 1 tablet (25 mg total) by mouth daily. 30 tablet 1   Misc Natural Products (SAMBUCUS ELDERBERRY IMMUNE) CHEW Chew 1 tablet by mouth daily.     Multiple Vitamin (MULTIVITAMIN ADULT PO) Take 2 tablets by mouth daily. 2 gummies by mouth daily, VitaFusion Multivitamin     UNABLE TO FIND Take 20 g by mouth daily. Med Name: Vital Proteins Collagen Peptides 4 tbsp daily     No facility-administered medications prior to visit.    No Known Allergies ROS  neg/noncontributory except as noted HPI/below      Objective:     BP 130/80   Pulse 94   Temp 98.5 F (36.9 C) (Temporal)   Ht '5\' 1"'$  (11.497m)   Wt 130 lb 6 oz (59.1 kg)   SpO2 98%   BMI 24.63 kg/m  Wt Readings from Last 3 Encounters:  11/18/22 130 lb 6 oz (59.1 kg)  10/19/22 130 lb 8 oz (59.2 kg)  09/20/22 131 lb (59.4 kg)    Physical Exam   Gen: WDWN NAD HEENT: NCAT, conjunctiva not injected, sclera nonicteric CARDIAC: RRR, S1S2+, no murmur.  EXT:  no edema MSK: no gross abnormalities.  NEURO: A&O x3.  CN II-XII intact.  PSYCH: normal mood. Good eye contact     Assessment & Plan:   Problem List Items Addressed This Visit       Cardiovascular and Mediastinum   Primary hypertension - Primary   HTN-chronic/new-not ideal.  Will increase Losartan to '50mg'$ .  Monitor.  Check bmp.  F/u 3.  No orders of the defined types were placed in this encounter.   AWellington Hampshire MD

## 2022-11-22 ENCOUNTER — Ambulatory Visit
Admission: RE | Admit: 2022-11-22 | Discharge: 2022-11-22 | Disposition: A | Discharge status: Home / Self Care | Payer: Medicare Other | Source: Ambulatory Visit | Attending: Family Medicine | Admitting: Family Medicine

## 2022-11-22 DIAGNOSIS — M816 Localized osteoporosis [Lequesne]: Secondary | ICD-10-CM

## 2022-11-22 DIAGNOSIS — M810 Age-related osteoporosis without current pathological fracture: Secondary | ICD-10-CM | POA: Diagnosis not present

## 2022-11-22 DIAGNOSIS — Z780 Asymptomatic menopausal state: Secondary | ICD-10-CM | POA: Diagnosis not present

## 2022-12-05 ENCOUNTER — Ambulatory Visit
Admission: RE | Admit: 2022-12-05 | Discharge: 2022-12-05 | Disposition: A | Discharge status: Home / Self Care | Payer: Medicare Other | Source: Ambulatory Visit | Attending: Obstetrics and Gynecology | Admitting: Obstetrics and Gynecology

## 2022-12-05 DIAGNOSIS — Z1231 Encounter for screening mammogram for malignant neoplasm of breast: Secondary | ICD-10-CM | POA: Diagnosis not present

## 2022-12-07 ENCOUNTER — Other Ambulatory Visit: Payer: Self-pay | Admitting: Obstetrics and Gynecology

## 2022-12-07 DIAGNOSIS — R928 Other abnormal and inconclusive findings on diagnostic imaging of breast: Secondary | ICD-10-CM

## 2022-12-17 LAB — HM MAMMOGRAPHY

## 2022-12-19 ENCOUNTER — Ambulatory Visit
Admission: RE | Admit: 2022-12-19 | Discharge: 2022-12-19 | Disposition: A | Discharge status: Home / Self Care | Payer: Medicare Other | Source: Ambulatory Visit | Attending: Obstetrics and Gynecology | Admitting: Obstetrics and Gynecology

## 2022-12-19 ENCOUNTER — Ambulatory Visit: Payer: Medicare Other

## 2022-12-19 DIAGNOSIS — R922 Inconclusive mammogram: Secondary | ICD-10-CM | POA: Diagnosis not present

## 2022-12-19 DIAGNOSIS — R928 Other abnormal and inconclusive findings on diagnostic imaging of breast: Secondary | ICD-10-CM

## 2022-12-21 ENCOUNTER — Encounter: Payer: Self-pay | Admitting: Family Medicine

## 2022-12-27 DIAGNOSIS — K080 Exfoliation of teeth due to systemic causes: Secondary | ICD-10-CM | POA: Diagnosis not present

## 2023-02-16 ENCOUNTER — Encounter: Payer: Self-pay | Admitting: Family Medicine

## 2023-02-16 ENCOUNTER — Ambulatory Visit (INDEPENDENT_AMBULATORY_CARE_PROVIDER_SITE_OTHER): Payer: Medicare Other | Admitting: Family Medicine

## 2023-02-16 VITALS — BP 160/92 | HR 88 | Temp 98.1°F | Resp 16 | Ht 61.0 in | Wt 135.2 lb

## 2023-02-16 DIAGNOSIS — I10 Essential (primary) hypertension: Secondary | ICD-10-CM | POA: Diagnosis not present

## 2023-02-16 MED ORDER — LOSARTAN POTASSIUM 100 MG PO TABS: 100.0000 mg | ORAL_TABLET | Freq: Every day | ORAL | 0 refills | Status: DC

## 2023-02-16 NOTE — Patient Instructions (Signed)
It was very nice to see you today!  Increase losartan to 100 mg.  Check twice/wk   PLEASE NOTE:  If you had any lab tests please let us know if you have not heard back within a few days. You may see your results on MyChart before we have a chance to review them but we will give you a call once they are reviewed by [place]. If we ordered any referrals today, please let us know if you have not heard from their office within the next week.   Please try these tips to maintain a healthy lifestyle:  Eat most of your calories during the day when you are active. Eliminate processed foods including packaged sweets (pies, cakes, cookies), reduce intake of potatoes, white bread, white pasta, and white rice. Look for whole grain options, oat flour or almond flour.  Each meal should contain half fruits/vegetables, one quarter protein, and one quarter carbs (no bigger than a computer mouse).  Cut down on sweet beverages. This includes juice, soda, and sweet tea. Also watch fruit intake, though this is a healthier sweet option, it still contains natural sugar! Limit to 3 servings daily.  Drink at least 1 glass of water with each meal and aim for at least 8 glasses per day  Exercise at least 150 minutes every week.

## 2023-02-16 NOTE — Assessment & Plan Note (Signed)
Chronic.  Not controlled.  Increase losartan to 100 mg daily.  Check blood pressure twice weekly.  Follow up 1 month

## 2023-02-16 NOTE — Progress Notes (Signed)
Subjective:     Patient ID: Cassandra Knight, female    DOB: [birth year], 72 y.o.   MRN: [id number]  Chief Complaint  Patient presents with   Medical Management of Chronic Issues    3 month follow-up for HTN Not fasting      HPI HYPERTENSION-Pt is on losartan .  Bp's running 120-151/70-95.  No ha/dizziness/cp/palp/edema/cough/sob   less exercise.  Has decreased salt.  Health Maintenance Due  Topic Date Due   DTaP/Tdap/Td (1 - Tdap) Never done   COLONOSCOPY (Pts 45-29yrs Insurance coverage will need to be confirmed)  07/13/2017    Past Medical History:  Diagnosis Date   Allergy    Arthritis    Melanoma    R upper arm   Osteopenia    Osteoporosis     Past Surgical History:  Procedure Laterality Date   BREAST EXCISIONAL BIOPSY Right    x 2, 2001 and 2003-took tomoxifen for 5 yrs and fosamax   broken 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   EYE SURGERY Bilateral 2006   lasix   MELANOMA EXCISION Right 2018   right upper arm   SALPINGOOPHORECTOMY Right 2002   TUBAL LIGATION  1984     Current Outpatient Medications:    Calcium-Phosphorus-Vitamin D (CALCIUM GUMMIES PO), Take 2 tablets by mouth daily. VitaFusion Calcium, Disp: , Rfl:    Homeopathic Products (ZICAM ALLERGY RELIEF NA), Place 2 sprays into the nose as needed., Disp: , Rfl:    ibuprofen (ADVIL,MOTRIN) 600 MG tablet, Take 1 tablet (600 mg total) by mouth every 8 (eight) hours as needed., Disp: 15 tablet, Rfl: 0   Misc Natural Products (SAMBUCUS ELDERBERRY IMMUNE) CHEW, Chew 1 tablet by mouth daily., Disp: , Rfl:    Multiple Vitamin (MULTIVITAMIN ADULT PO), Take 2 tablets by mouth daily. 2 gummies by mouth daily, VitaFusion Multivitamin, Disp: , Rfl:    UNABLE TO FIND, Take 20 g by mouth daily. Med Name: Vital Proteins Collagen Peptides 4 tbsp daily, Disp: , Rfl:    losartan (COZAAR) 100 MG tablet, Take 1 tablet (100 mg total) by mouth daily., Disp: 90 tablet, Rfl: 0  No Known Allergies ROS  neg/noncontributory except as noted HPI/below      Objective:     BP (!) 160/92 (BP Location: Left Arm, Patient Position: Sitting, Cuff Size: Normal)   Pulse 88   Temp 98.1 F (36.7 C) (Temporal)   Resp 16   Ht  (1.549 m)   Wt 135 lb 4 oz (61.3 kg)   SpO2 97%   BMI 25.56 kg/m  Wt Readings from Last 3 Encounters:  02/16/23 135 lb 4 oz (61.3 kg)  11/18/22 130 lb 6 oz (59.1 kg)  10/19/22 130 lb 8 oz (59.2 kg)    Physical Exam   Gen: WDWN NAD HEENT: NCAT, conjunctiva not injected, sclera nonicteric NECK:  supple, no thyromegaly, no nodes, R carotid bruits CARDIAC: RRR, S1S2+, no murmur. DP 2+B LUNGS: CTAB. No wheezes ABDOMEN:  BS+, soft, NTND, No HSM, no masses EXT:  no edema MSK: no gross abnormalities.  NEURO: A&O x3.  CN II-XII intact.  PSYCH: normal mood. Good eye contact     Assessment & Plan:  Primary hypertension Assessment & Plan: Chronic.  Not controlled.  Increase losartan to 100 mg daily.  Check blood pressure twice weekly.  Follow up 1 month   Other orders -     Losartan Potassium; Take 1 tablet (100 mg total)  by mouth daily.  Dispense: 90 tablet; Refill: 0    Angelena Sole, MD

## 2023-03-16 ENCOUNTER — Ambulatory Visit (INDEPENDENT_AMBULATORY_CARE_PROVIDER_SITE_OTHER): Payer: Medicare Other | Admitting: Family Medicine

## 2023-03-16 ENCOUNTER — Encounter: Payer: Self-pay | Admitting: Family Medicine

## 2023-03-16 VITALS — BP 130/84 | HR 55 | Temp 98.3°F | Resp 16 | Ht 61.0 in | Wt 135.0 lb

## 2023-03-16 DIAGNOSIS — I10 Essential (primary) hypertension: Secondary | ICD-10-CM | POA: Diagnosis not present

## 2023-03-16 MED ORDER — LOSARTAN POTASSIUM 100 MG PO TABS: 100.0000 mg | ORAL_TABLET | Freq: Every day | ORAL | 1 refills | Status: DC

## 2023-03-16 NOTE — Progress Notes (Signed)
   Subjective:     Patient ID: Cassandra Knight, female    DOB: October 25, 1950, 72 y.o.   MRN: [id number]  Chief Complaint  Patient presents with   Medical Management of Chronic Issues    Follow-up on HTN Had coffee and water, no food, took B/p meds this morning    HPI  HTN-Pt is on cozaar 100mg   Bp's running  117-144/70's.  Heart rate 80-100.  No ha/dizziness/cp/palp/edema/cough/sob  Patient waiting on gastroenterology schedule to schedule colon in fall  Health Maintenance Due  Topic Date Due   DTaP/Tdap/Td (1 - Tdap) Never done   COLONOSCOPY (Pts 45-39yrs Insurance coverage will need to be confirmed)  07/13/2017    Past Medical History:  Diagnosis Date   Allergy    Arthritis    Melanoma (HCC)    R upper arm   Osteopenia    Osteoporosis     Past Surgical History:  Procedure Laterality Date   BREAST EXCISIONAL BIOPSY Right    x 2, 2001 and 2003-took tomoxifen for 5 yrs and fosamax   broken 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   EYE SURGERY Bilateral 2006   lasix   MELANOMA EXCISION Right 2018   right upper arm   SALPINGOOPHORECTOMY Right 2002   TUBAL LIGATION  1984     Current Outpatient Medications:    Calcium-Phosphorus-Vitamin D (CALCIUM GUMMIES PO), Take 2 tablets by mouth daily. VitaFusion Calcium, Disp: , Rfl:    Homeopathic Products (ZICAM ALLERGY RELIEF NA), Place 2 sprays into the nose as needed., Disp: , Rfl:    ibuprofen (ADVIL,MOTRIN) 600 MG tablet, Take 1 tablet (600 mg total) by mouth every 8 (eight) hours as needed., Disp: 15 tablet, Rfl: 0   losartan (COZAAR) 100 MG tablet, Take 1 tablet (100 mg total) by mouth daily., Disp: 90 tablet, Rfl: 0   Misc Natural Products (SAMBUCUS ELDERBERRY IMMUNE) CHEW, Chew 1 tablet by mouth daily., Disp: , Rfl:    Multiple Vitamin (MULTIVITAMIN ADULT PO), Take 2 tablets by mouth daily. 2 gummies by mouth daily, VitaFusion Multivitamin, Disp: , Rfl:    UNABLE TO FIND, Take 20 g by mouth daily. Med Name: Vital  Proteins Collagen Peptides 4 tbsp daily, Disp: , Rfl:   No Known Allergies ROS neg/noncontributory except as noted HPI/below      Objective:     BP 130/84   Pulse (!) 55   Temp 98.3 F (36.8 C) (Temporal)   Resp 16   Ht 5\' 1"  (1.549 m)   Wt 135 lb (61.2 kg)   SpO2 95%   BMI 25.51 kg/m  Wt Readings from Last 3 Encounters:  03/16/23 135 lb (61.2 kg)  02/16/23 135 lb 4 oz (61.3 kg)  11/18/22 130 lb 6 oz (59.1 kg)    Physical Exam   Gen: WDWN NAD HEENT: NCAT, conjunctiva not injected, sclera nonicteric NECK:  supple, no thyromegaly, no nodes, + R carotid bruits CARDIAC: RRR, S1S2+, no murmur. LUNGS: CTAB. No wheezes EXT:  no edema MSK: no gross abnormalities.  NEURO: A&O x3.  CN II-XII intact.  PSYCH: normal mood. Good eye contact     Assessment & Plan:  There are no diagnoses linked to this encounter.  Angelena Sole, MD

## 2023-03-16 NOTE — Assessment & Plan Note (Signed)
Chronic.  Controlled.  Continue losartan 100 mg daily  follow up 6 mo

## 2023-03-16 NOTE — Patient Instructions (Signed)
It was very nice to see you today!  Check blood pressure weekly.  Let me know if high or low   PLEASE NOTE:  If you had any lab tests please let us know if you have not heard back within a few days. You may see your results on MyChart before we have a chance to review them but we will give you a call once they are reviewed by [place]. If we ordered any referrals today, please let us know if you have not heard from their office within the next week.   Please try these tips to maintain a healthy lifestyle:  Eat most of your calories during the day when you are active. Eliminate processed foods including packaged sweets (pies, cakes, cookies), reduce intake of potatoes, white bread, white pasta, and white rice. Look for whole grain options, oat flour or almond flour.  Each meal should contain half fruits/vegetables, one quarter protein, and one quarter carbs (no bigger than a computer mouse).  Cut down on sweet beverages. This includes juice, soda, and sweet tea. Also watch fruit intake, though this is a healthier sweet option, it still contains natural sugar! Limit to 3 servings daily.  Drink at least 1 glass of water with each meal and aim for at least 8 glasses per day  Exercise at least 150 minutes every week.

## 2023-05-17 DIAGNOSIS — L820 Inflamed seborrheic keratosis: Secondary | ICD-10-CM | POA: Diagnosis not present

## 2023-07-03 DIAGNOSIS — K080 Exfoliation of teeth due to systemic causes: Secondary | ICD-10-CM | POA: Diagnosis not present

## 2023-08-02 ENCOUNTER — Encounter: Payer: Self-pay | Admitting: Gastroenterology

## 2023-08-07 DIAGNOSIS — Z86018 Personal history of other benign neoplasm: Secondary | ICD-10-CM | POA: Diagnosis not present

## 2023-08-07 DIAGNOSIS — L814 Other melanin hyperpigmentation: Secondary | ICD-10-CM | POA: Diagnosis not present

## 2023-08-07 DIAGNOSIS — L821 Other seborrheic keratosis: Secondary | ICD-10-CM | POA: Diagnosis not present

## 2023-08-07 DIAGNOSIS — Z85820 Personal history of malignant melanoma of skin: Secondary | ICD-10-CM | POA: Diagnosis not present

## 2023-08-29 ENCOUNTER — Ambulatory Visit (AMBULATORY_SURGERY_CENTER): Payer: Medicare Other

## 2023-08-29 VITALS — Ht 59.0 in | Wt 134.0 lb

## 2023-08-29 DIAGNOSIS — Z800 Family history of malignant neoplasm of digestive organs: Secondary | ICD-10-CM

## 2023-08-29 MED ORDER — ONDANSETRON HCL 4 MG PO TABS: 4.0000 mg | ORAL_TABLET | ORAL | 0 refills | Status: DC

## 2023-08-29 MED ORDER — NA SULFATE-K SULFATE-MG SULF 17.5-3.13-1.6 GM/177ML PO SOLN: 1.0000 | Freq: Once | ORAL | 0 refills | Status: AC

## 2023-08-29 NOTE — Progress Notes (Signed)
No egg or soy allergy known to patient   No issues known to pt with past sedation with any surgeries or procedures  Patient denies ever being told they had issues or difficulty with intubation   No FH of Malignant Hyperthermia  Pt is not on diet pills  Pt is not on  home 02   Pt is not on blood thinners   Pt denies issues with constipation   No A fib or A flutter  Have any cardiac testing pending--no  Virtual previsit  Pt instructed to use Single[employer] or  GoodRx for a price reduction on prep   Patient's chart reviewed by Cathlyn Parsons CRNA prior to previsit and patient appropriate for the [facility].  Previsit completed and red dot placed by patient's name on their procedure day (on provider's schedule).

## 2023-09-07 ENCOUNTER — Encounter: Payer: Self-pay | Admitting: Gastroenterology

## 2023-09-14 NOTE — Progress Notes (Signed)
Subjective:    Patient ID: Cassandra Knight, female    DOB: [birth year], 72 y.o.   MRN: [id number]  Chief Complaint  Patient presents with   Medical Management of Chronic Issues    6 month follow-up on htn No food, had a cup of coffee with a little creamer Colonoscopy scheduled for 09/26/23   HPI Fasting - No food, 1 cup of coffee w/ some creamer  Hypertension - Managed with Cozaar 100 mg. BP's running 160/70 on initial check, 154/86 on recheck, and an average of systolic 110-120s at home. Her at-home cuff has been checked. Reports that she exercises regularly. Denies ha/dizziness/chest pains/palp/edema/cough/sob.   Colonoscopy - scheduled for 09/26/23.    AWV set for 12/2  Health Maintenance Due  Topic Date Due   Colonoscopy  07/13/2017   Medicare Annual Wellness (AWV)  09/21/2023   Past Medical History:  Diagnosis Date   Allergy    Arthritis    Hypertension    Melanoma (HCC)    R upper arm   Osteopenia    Osteoporosis     Past Surgical History:  Procedure Laterality Date   BREAST EXCISIONAL BIOPSY Right    x 2, 2001 and 2003-took tomoxifen for 5 yrs and fosamax   broken 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   COLONOSCOPY  2013   EYE SURGERY Bilateral 2006   lasix   MELANOMA EXCISION Right 2018   right upper arm   SALPINGOOPHORECTOMY Right 2002   TUBAL LIGATION  1984     Current Outpatient Medications:    acetaminophen (TYLENOL) 325 MG tablet, Take 650 mg by mouth every 6 (six) hours as needed., Disp: , Rfl:    Calcium-Phosphorus-Vitamin D (CALCIUM GUMMIES PO), Take 2 tablets by mouth daily. VitaFusion Calcium, Disp: , Rfl:    Homeopathic Products (ZICAM ALLERGY RELIEF NA), Place 2 sprays into the nose as needed., Disp: , Rfl:    ibuprofen (ADVIL,MOTRIN) 600 MG tablet, Take 1 tablet (600 mg total) by mouth every 8 (eight) hours as needed., Disp: 15 tablet, Rfl: 0   Misc Natural Products (SAMBUCUS ELDERBERRY IMMUNE) CHEW, Chew 1 tablet by mouth daily.,  Disp: , Rfl:    Multiple Vitamin (MULTIVITAMIN ADULT PO), Take 2 tablets by mouth daily. 2 gummies by mouth daily, VitaFusion Multivitamin, Disp: , Rfl:    ondansetron (ZOFRAN) 4 MG tablet, Take 1 tablet (4 mg total) by mouth as directed. Take one Zofran 4 mg tablet 30-60 minutes before each prep dose, Disp: 2 tablet, Rfl: 0   UNABLE TO FIND, Take 20 g by mouth daily. Med Name: Vital Proteins Collagen Peptides 4 tbsp daily, Disp: , Rfl:    losartan (COZAAR) 100 MG tablet, Take 1 tablet (100 mg total) by mouth daily., Disp: 90 tablet, Rfl: 3  No Known Allergies ROS neg/noncontributory except as noted HPI/below  Objective:  BP (!) 154/86 (BP Location: Left Arm, Patient Position: Sitting, Cuff Size: Large)   Pulse 60   Temp 98.4 F (36.9 C) (Temporal)   Resp 18   Ht 5\' 1"  (1.549 m)   Wt 135 lb 4 oz (61.3 kg)   SpO2 97%   BMI 25.56 kg/m  Wt Readings from Last 3 Encounters:  09/15/23 135 lb 4 oz (61.3 kg)  08/29/23 134 lb (60.8 kg)  03/16/23 135 lb (61.2 kg)   Physical Exam   Gen: WDWN NAD HEENT: NCAT, conjunctiva not injected, sclera nonicteric NECK:  supple, no thyromegaly, no nodes, right carotid  bruit CARDIAC: intermittent ectopic beats-almost bigeminy pattern, S1S2+, no murmur. DP 2+B LUNGS: CTAB. No wheezes ABDOMEN:  BS+, soft, NTND, No HSM, no masses EXT:  no edema MSK: no gross abnormalities.  NEURO: A&O x3.  CN II-XII intact.  PSYCH: normal mood. Good eye contact  EKG: bigeminy.  No acute st changes.    Assessment & Plan:  Primary hypertension Assessment & Plan: Chronic.  Controlled outpt.  Continue losartan 100 mg daily  check am bp's and if continues to be >140, either split losartan to 1/2 bid or consider valsartan 320mg .    Orders: -     Comprehensive metabolic panel -     Lipid panel -     Losartan Potassium; Take 1 tablet (100 mg total) by mouth daily.  Dispense: 90 tablet; Refill: 3 -     CBC with Differential/Platelet -     TSH -     EKG 12-Lead -      Magnesium  Abnormal EKG -     Ambulatory referral to Cardiology  Bigeminal rhythm -     Ambulatory referral to Cardiology  2.  Abn EKG/bigeminy-asymptomatic.  Refer Card.  Check labs.  Return in about 1 year (around 09/14/2024) for HTN.   I,Emily Lagle,acting as a Neurosurgeon for Angelena Sole, MD.,have documented all relevant documentation on the behalf of Angelena Sole, MD,as directed by  Angelena Sole, MD while in the presence of Angelena Sole, MD.  I, Angelena Sole, MD, have reviewed all documentation for this visit. The documentation on 09/15/23 for the exam, diagnosis, procedures, and orders are all accurate and complete.   Angelena Sole, MD

## 2023-09-15 ENCOUNTER — Encounter: Payer: Self-pay | Admitting: Family Medicine

## 2023-09-15 ENCOUNTER — Ambulatory Visit: Payer: Medicare Other | Admitting: Family Medicine

## 2023-09-15 VITALS — BP 154/86 | HR 60 | Temp 98.4°F | Resp 18 | Ht 61.0 in | Wt 135.2 lb

## 2023-09-15 DIAGNOSIS — I498 Other specified cardiac arrhythmias: Secondary | ICD-10-CM | POA: Diagnosis not present

## 2023-09-15 DIAGNOSIS — I10 Essential (primary) hypertension: Secondary | ICD-10-CM

## 2023-09-15 DIAGNOSIS — R9431 Abnormal electrocardiogram [ECG] [EKG]: Secondary | ICD-10-CM

## 2023-09-15 LAB — CBC WITH DIFFERENTIAL/PLATELET
Basophils Absolute: 0 10*3/uL (ref 0.0–0.1)
Basophils Relative: 0.6 % (ref 0.0–3.0)
Eosinophils Absolute: 0.1 10*3/uL (ref 0.0–0.7)
Eosinophils Relative: 0.9 % (ref 0.0–5.0)
HCT: 42.2 % (ref 36.0–46.0)
Hemoglobin: 14.3 g/dL (ref 12.0–15.0)
Lymphocytes Relative: 24.4 % (ref 12.0–46.0)
Lymphs Abs: 1.6 10*3/uL (ref 0.7–4.0)
MCHC: 33.9 g/dL (ref 30.0–36.0)
MCV: 101.8 fL — ABNORMAL HIGH (ref 78.0–100.0)
Monocytes Absolute: 0.5 10*3/uL (ref 0.1–1.0)
Monocytes Relative: 7 % (ref 3.0–12.0)
Neutro Abs: 4.4 10*3/uL (ref 1.4–7.7)
Neutrophils Relative %: 67.1 % (ref 43.0–77.0)
Platelets: 237 10*3/uL (ref 150.0–400.0)
RBC: 4.15 Mil/uL (ref 3.87–5.11)
RDW: 12.7 % (ref 11.5–15.5)
WBC: 6.6 10*3/uL (ref 4.0–10.5)

## 2023-09-15 LAB — LIPID PANEL
Cholesterol: 190 mg/dL (ref 0–200)
HDL: 50.8 mg/dL (ref >39.00)
LDL Cholesterol: 112 mg/dL — ABNORMAL HIGH (ref 0–99)
NonHDL: 139.68
Total CHOL/HDL Ratio: 4
Triglycerides: 139 mg/dL (ref 0.0–149.0)
VLDL: 27.8 mg/dL (ref 0.0–40.0)

## 2023-09-15 LAB — COMPREHENSIVE METABOLIC PANEL
ALT: 16 U/L (ref 0–35)
AST: 21 U/L (ref 0–37)
Albumin: 4.4 g/dL (ref 3.5–5.2)
Alkaline Phosphatase: 67 U/L (ref 39–117)
BUN: 22 mg/dL (ref 6–23)
CO2: 29 meq/L (ref 19–32)
Calcium: 9.9 mg/dL (ref 8.4–10.5)
Chloride: 104 meq/L (ref 96–112)
Creatinine, Ser: 0.81 mg/dL (ref 0.40–1.20)
GFR: 73.74 mL/min (ref >60.00)
Glucose, Bld: 97 mg/dL (ref 70–99)
Potassium: 4.2 meq/L (ref 3.5–5.1)
Sodium: 141 meq/L (ref 135–145)
Total Bilirubin: 0.7 mg/dL (ref 0.2–1.2)
Total Protein: 6.8 g/dL (ref 6.0–8.3)

## 2023-09-15 LAB — MAGNESIUM: Magnesium: 2 mg/dL (ref 1.5–2.5)

## 2023-09-15 LAB — TSH: TSH: 0.76 u[IU]/mL (ref 0.35–5.50)

## 2023-09-15 MED ORDER — LOSARTAN POTASSIUM 100 MG PO TABS: 100.0000 mg | ORAL_TABLET | Freq: Every day | ORAL | 3 refills | Status: DC

## 2023-09-15 NOTE — Patient Instructions (Signed)
It was very nice to see you today!  Happy Holidays!   PLEASE NOTE:  If you had any lab tests please let us know if you have not heard back within a few days. You may see your results on MyChart before we have a chance to review them but we will give you a call once they are reviewed by us. If we ordered any referrals today, please let us know if you have not heard from their office within the next week.   Please try these tips to maintain a healthy lifestyle:  Eat most of your calories during the day when you are active. Eliminate processed foods including packaged sweets (pies, cakes, cookies), reduce intake of potatoes, white bread, white pasta, and white rice. Look for whole grain options, oat flour or almond flour.  Each meal should contain half fruits/vegetables, one quarter protein, and one quarter carbs (no bigger than a computer mouse).  Cut down on sweet beverages. This includes juice, soda, and sweet tea. Also watch fruit intake, though this is a healthier sweet option, it still contains natural sugar! Limit to 3 servings daily.  Drink at least 1 glass of water with each meal and aim for at least 8 glasses per day  Exercise at least 150 minutes every week.   

## 2023-09-15 NOTE — Assessment & Plan Note (Signed)
Chronic.  Controlled outpt.  Continue losartan 100 mg daily  check am bp's and if continues to be >140, either split losartan to 1/2 bid or consider valsartan 320mg .

## 2023-09-16 NOTE — Progress Notes (Signed)
great

## 2023-09-25 ENCOUNTER — Ambulatory Visit (INDEPENDENT_AMBULATORY_CARE_PROVIDER_SITE_OTHER): Payer: Medicare Other

## 2023-09-25 VITALS — Wt 135.0 lb

## 2023-09-25 DIAGNOSIS — Z0000 Encounter for general adult medical examination without abnormal findings: Secondary | ICD-10-CM | POA: Diagnosis not present

## 2023-09-25 NOTE — Patient Instructions (Signed)
Ms. Heap , Thank you for taking time to come for your Medicare Wellness Visit. I appreciate your ongoing commitment to your health goals. Please review the following plan we discussed and let me know if I can assist you in the future.   Referrals/Orders/Follow-Ups/Clinician Recommendations: Aim for 30 minutes of exercise or brisk walking, 6-8 glasses of water, and 5 servings of fruits and vegetables each day. Work to lose about 10 lbs   This is a list of the screening recommended for you and due dates:  Health Maintenance  Topic Date Due   Colon Cancer Screening  07/13/2017   Medicare Annual Wellness Visit  09/21/2023   COVID-19 Vaccine (7 - 2023-24 season) 10/18/2023*   Mammogram  12/17/2024   DTaP/Tdap/Td vaccine (2 - Td or Tdap) 07/17/2033   Pneumonia Vaccine  Completed   Flu Shot  Completed   DEXA scan (bone density measurement)  Completed   Hepatitis C Screening  Completed   Zoster (Shingles) Vaccine  Completed   HPV Vaccine  Aged Out  *Topic was postponed. The date shown is not the original due date.    Advanced directives: (In Chart) A copy of your advanced directives are scanned into your chart should your provider ever need it.  Next Medicare Annual Wellness Visit scheduled for next year: Yes

## 2023-09-25 NOTE — Progress Notes (Signed)
Subjective:   Cassandra Knight is a 72 y.o. female who presents for Medicare Annual (Subsequent) preventive examination.  Visit Complete: Virtual I connected with  [name] on 09/25/23 by a audio enabled telemedicine application and verified that I am speaking with the correct person using two identifiers.  Patient Location: Home  Provider Location: Office/Clinic  I discussed the limitations of evaluation and management by telemedicine. The patient expressed understanding and agreed to proceed.  Vital Signs: Because this visit was a virtual/telehealth visit, some criteria may be missing or patient reported. Any vitals not documented were not able to be obtained and vitals that have been documented are patient reported.   Cardiac Risk Factors include: advanced age (>22men, >22 women);hypertension     Objective:    Today's Vitals   09/25/23 0828  Weight: 135 lb (61.2 kg)   Body mass index is 25.51 kg/m.     09/25/2023    8:33 AM 09/20/2022    8:43 AM 09/28/2015    8:08 PM  Advanced Directives  Does Patient Have a Medical Advance Directive? Yes Yes No  Type of Estate agent of [place];Living will Healthcare Power of [place];Living will   Does patient want to make changes to medical advance directive? No - Patient declined    Copy of Healthcare Power of Attorney in Chart? Yes - validated most recent copy scanned in chart (See row information) No - copy requested   Would patient like information on creating a medical advance directive?   No - patient declined information    Current Medications (verified) Outpatient Encounter Medications as of 09/25/2023  Medication Sig   acetaminophen (TYLENOL) 325 MG tablet Take 650 mg by mouth every 6 (six) hours as needed.   Calcium-Phosphorus-Vitamin D (CALCIUM GUMMIES PO) Take 2 tablets by mouth daily. VitaFusion Calcium   Homeopathic Products (ZICAM ALLERGY RELIEF NA) Place 2 sprays into the nose as needed.    ibuprofen (ADVIL,MOTRIN) 600 MG tablet Take 1 tablet (600 mg total) by mouth every 8 (eight) hours as needed.   losartan (COZAAR) 100 MG tablet Take 1 tablet (100 mg total) by mouth daily.   Misc Natural Products (SAMBUCUS ELDERBERRY IMMUNE) CHEW Chew 1 tablet by mouth daily.   Multiple Vitamin (MULTIVITAMIN ADULT PO) Take 2 tablets by mouth daily. 2 gummies by mouth daily, VitaFusion Multivitamin   Na Sulfate-K Sulfate-Mg Sulf 17.5-3.13-1.6 GM/177ML SOLN See admin instructions.   ondansetron (ZOFRAN) 4 MG tablet Take 1 tablet (4 mg total) by mouth as directed. Take one Zofran 4 mg tablet 30-60 minutes before each prep dose   UNABLE TO FIND Take 20 g by mouth daily. Med Name: Vital Proteins Collagen Peptides 4 tbsp daily   No facility-administered encounter medications on file as of 09/25/2023.    Allergies (verified) Patient has no known allergies.   History: Past Medical History:  Diagnosis Date   Allergy    Arthritis    Hypertension    Melanoma (HCC)    R upper arm   Osteopenia    Osteoporosis    Past Surgical History:  Procedure Laterality Date   BREAST EXCISIONAL BIOPSY Right    x 2, 2001 and 2003-took tomoxifen for 5 yrs and fosamax   broken 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   COLONOSCOPY  2013   EYE SURGERY Bilateral 2006   lasix   MELANOMA EXCISION Right 2018   right upper arm   SALPINGOOPHORECTOMY Right 2002  TUBAL LIGATION  1984   Family History  Problem Relation Age of Onset   Hypertension Mother    Cancer Mother    Arthritis Mother    Breast cancer Mother    Colon polyps Father    Cancer Father    Early death Father    Colon cancer Father    Asthma Sister    Arthritis Sister    Arthritis Brother    Early death Maternal Grandfather    Heart attack Maternal Grandfather    Esophageal cancer Neg Hx    Stomach cancer Neg Hx    Rectal cancer Neg Hx    Social History   Socioeconomic History   Marital status: Married    Spouse name: Not  on file   Number of children: 3   Years of education: Not on file   Highest education level: Associate degree: occupational, Scientist, product/process development, or vocational program  Occupational History   Not on file  Tobacco Use   Smoking status: Former    Types: Cigarettes   Smokeless tobacco: Never   Tobacco comments:    Smoked for 4 years,in her teens half pack or so  Vaping Use   Vaping status: Never Used  Substance and Sexual Activity   Alcohol use: Yes    Alcohol/week: 3.0 - 5.0 standard drinks of alcohol    Types: 3 - 5 Glasses of wine per week    Comment: 1/d not daily   Drug use: Not Currently    Types: Marijuana    Comment: used in the past-teens   Sexual activity: Not Currently  Other Topics Concern   Not on file  Social History Narrative   3 grands   Retired Clinical biochemist   Social Determinants of Health   Financial Resource Strain: Low Risk  (09/25/2023)   Overall Financial Resource Strain (CARDIA)    Difficulty of Paying Living Expenses: Not hard at all  Food Insecurity: No Food Insecurity (09/25/2023)   Hunger Vital Sign    Worried About Running Out of Food in the Last Year: Never true    Ran Out of Food in the Last Year: Never true  Transportation Needs: No Transportation Needs (09/25/2023)   PRAPARE - Administrator, Civil Service (Medical): No    Lack of Transportation (Non-Medical): No  Physical Activity: Sufficiently Active (09/25/2023)   Exercise Vital Sign    Days of Exercise per Week: 5 days    Minutes of Exercise per Session: 30 min  Stress: No Stress Concern Present (09/25/2023)   [employer] of Occupational Health - Occupational Stress Questionnaire    Feeling of Stress : Not at all  Social Connections: Moderately Integrated (09/25/2023)   Social Connection and Isolation Panel [NHANES]    Frequency of Communication with Friends and Family: More than three times a week    Frequency of Social Gatherings with Friends and Family: More than three times  a week    Attends Religious Services: Never    Database administrator or Organizations: Yes    Attends Engineer, structural: 1 to 4 times per year    Marital Status: Married    Tobacco Counseling Counseling given: Not Answered Tobacco comments: Smoked for 4 years,in her teens half pack or so   Clinical Intake:  Pre-visit preparation completed: Yes  Pain : No/denies pain     BMI - recorded: 25.51 Nutritional Status: BMI 25 -29 Overweight Diabetes: No  How often do you need to have  someone help you when you read instructions, pamphlets, or other written materials from your doctor or pharmacy?: 1 - Never  Interpreter Needed?: No  Information entered by :: Lanier Ensign, LPN   Activities of Daily Living    09/25/2023    8:29 AM  In your present state of health, do you have any difficulty performing the following activities:  Hearing? 0  Vision? 0  Difficulty concentrating or making decisions? 0  Walking or climbing stairs? 0  Dressing or bathing? 0  Doing errands, shopping? 0  Preparing Food and eating ? N  Using the Toilet? N  In the past six months, have you accidently leaked urine? N  Do you have problems with loss of bowel control? N  Managing your Medications? N  Managing your Finances? N  Housekeeping or managing your Housekeeping? N    Patient Care Team: Jeani Sow, MD as PCP - General (Family Medicine)  Indicate any recent Medical Services you may have received from other than [facility] providers in the past year (date may be approximate).     Assessment:   This is a routine wellness examination for Nihla.  Hearing/Vision screen Hearing Screening - Comments:: Pt denies any hearing issues  Vision Screening - Comments:: Pt will follows up with Dr Cherlynn Polo as a new patient    Goals Addressed             This Visit's Progress    Patient Stated       Lose 10 lbs        Depression Screen    09/25/2023    8:31 AM 09/15/2023    9:20  AM 03/16/2023    9:24 AM 02/16/2023    8:58 AM 11/18/2022   10:22 AM 09/20/2022    8:41 AM 07/22/2022    2:10 PM  PHQ 2/9 Scores  PHQ - 2 Score 0 0 0 0 0 0 0  PHQ- 9 Score 0 0 0 0 0 0 1    Fall Risk    09/25/2023    8:33 AM 09/15/2023    9:20 AM 03/16/2023    9:23 AM 02/16/2023    8:58 AM 11/18/2022   10:22 AM  Fall Risk   Falls in the past year? 0 0 0 0 0  Number falls in past yr: 0 0 0 0 0  Injury with Fall? 0 0 0 0 0  Risk for fall due to : No Fall Risks No Fall Risks No Fall Risks No Fall Risks No Fall Risks  Follow up Falls prevention discussed Falls evaluation completed Falls evaluation completed Falls evaluation completed Falls evaluation completed    MEDICARE RISK AT HOME: Medicare Risk at Home Any stairs in or around the home?: Yes If so, are there any without handrails?: No Home free of loose throw rugs in walkways, pet beds, electrical cords, etc?: Yes Adequate lighting in your home to reduce risk of falls?: Yes Life alert?: No Use of a cane, walker or w/c?: No Grab bars in the bathroom?: Yes Shower chair or bench in shower?: Yes Elevated toilet seat or a handicapped toilet?: No  TIMED UP AND GO:  Was the test performed?  No    Cognitive Function:        09/25/2023    8:34 AM 09/20/2022    8:44 AM  6CIT Screen  What Year? 0 points 0 points  What month? 0 points 0 points  What time? 0 points 0 points  Count back  from 20 0 points 0 points  Months in reverse 0 points 0 points  Repeat phrase 0 points 0 points  Total Score 0 points 0 points    Immunizations Immunization History  Administered Date(s) Administered   Fluad Quad(high Dose 65+) 07/08/2019, 07/22/2022   Influenza Inj Mdck Quad Pf 08/09/2017, 08/13/2018   Influenza,inj,quad, With Preservative 09/28/2015   Influenza,trivalent, recombinat, inj, PF 07/31/2014   Influenza-Unspecified 08/11/2016, 08/09/2017, 08/13/2018, 07/18/2023   PFIZER(Purple Top)SARS-COV-2 Vaccination 11/16/2019, 12/07/2019,  07/28/2020   Pfizer Covid-19 Vaccine Bivalent Booster 58yrs & up 07/30/2021   Pfizer(Comirnaty)Fall Seasonal Vaccine 12 years and older 08/04/2022, 07/18/2023   Pneumococcal Conjugate-13 07/08/2019   Pneumococcal Polysaccharide-23 07/07/2020   Tdap 07/18/2023   Zoster Recombinant(Shingrix) 08/02/2019, 11/01/2019   Zoster, Live 08/28/2015    TDAP status: Up to date  Flu Vaccine status: Up to date  Pneumococcal vaccine status: Up to date  Covid-19 vaccine status: Information provided on how to obtain vaccines.   Qualifies for Shingles Vaccine? Yes   Zostavax completed Yes   Shingrix Completed?: Yes  Screening Tests Health Maintenance  Topic Date Due   Colonoscopy  07/13/2017   COVID-19 Vaccine (7 - 2023-24 season) 10/18/2023 (Originally 09/12/2023)   Medicare Annual Wellness (AWV)  09/24/2024   MAMMOGRAM  12/17/2024   DTaP/Tdap/Td (2 - Td or Tdap) 07/17/2033   Pneumonia Vaccine 66+ Years old  Completed   INFLUENZA VACCINE  Completed   DEXA SCAN  Completed   Hepatitis C Screening  Completed   Zoster Vaccines- Shingrix  Completed   HPV VACCINES  Aged Out    Health Maintenance  Health Maintenance Due  Topic Date Due   Colonoscopy  07/13/2017    Colorectal cancer screening: Referral to GI placed scheduled for 09/26/23. Pt aware the office will call re: appt.  Mammogram status: Completed 12/17/22. Repeat every year  Bone Density status: Completed 1//30/24. Results reflect: Bone density results: OSTEOPOROSIS. Repeat every 2 years.  Additional Screening:  Hepatitis C Screening:  Completed 10/19/22  Vision Screening: Recommended annual ophthalmology exams for early detection of glaucoma and other disorders of the eye. Is the patient up to date with their annual eye exam?  No  Who is the provider or what is the name of the office in which the patient attends annual eye exams? Dr Cherlynn Polo  If pt is not established with a provider, would they like to be referred to a provider  to establish care? No .   Dental Screening: Recommended annual dental exams for proper oral hygiene  Community Resource Referral / Chronic Care Management: CRR required this visit?  No   CCM required this visit?  No     Plan:     I have personally reviewed and noted the following in the patient's chart:   Medical and social history Use of alcohol, tobacco or illicit drugs  Current medications and supplements including opioid prescriptions. Patient is not currently taking opioid prescriptions. Functional ability and status Nutritional status Physical activity Advanced directives List of other physicians Hospitalizations, surgeries, and ER visits in previous 12 months Vitals Screenings to include cognitive, depression, and falls Referrals and appointments  In addition, I have reviewed and discussed with patient certain preventive protocols, quality metrics, and best practice recommendations. A written personalized care plan for preventive services as well as general preventive health recommendations were provided to patient.     Marzella Schlein, LPN   [id number]   After Visit Summary: (MyChart) Due to this being a telephonic visit,  the after visit summary with patients personalized plan was offered to patient via MyChart   Nurse Notes: none

## 2023-09-26 ENCOUNTER — Ambulatory Visit (AMBULATORY_SURGERY_CENTER): Payer: Medicare Other | Admitting: Gastroenterology

## 2023-09-26 ENCOUNTER — Encounter: Payer: Self-pay | Admitting: Gastroenterology

## 2023-09-26 VITALS — BP 116/56 | HR 65 | Temp 97.9°F | Resp 13 | Ht 59.0 in | Wt 134.0 lb

## 2023-09-26 DIAGNOSIS — K644 Residual hemorrhoidal skin tags: Secondary | ICD-10-CM

## 2023-09-26 DIAGNOSIS — K6389 Other specified diseases of intestine: Secondary | ICD-10-CM | POA: Diagnosis not present

## 2023-09-26 DIAGNOSIS — Z1211 Encounter for screening for malignant neoplasm of colon: Secondary | ICD-10-CM

## 2023-09-26 DIAGNOSIS — K5730 Diverticulosis of large intestine without perforation or abscess without bleeding: Secondary | ICD-10-CM | POA: Diagnosis not present

## 2023-09-26 DIAGNOSIS — K648 Other hemorrhoids: Secondary | ICD-10-CM

## 2023-09-26 DIAGNOSIS — Z800 Family history of malignant neoplasm of digestive organs: Secondary | ICD-10-CM | POA: Diagnosis not present

## 2023-09-26 MED ORDER — SODIUM CHLORIDE 0.9 % IV SOLN: 500.0000 mL | Freq: Once | INTRAVENOUS | Status: DC

## 2023-09-26 NOTE — Op Note (Signed)
[facility] Patient Name: Cassandra Knight Procedure Date: 09/26/2023 11:25 AM MRN: [id number] Endoscopist: Napoleon Form , MD, [id number] Age: 72 Referring MD:  Date of Birth: 05-Nov-1950 Gender: Female Account #: [id number] Procedure:                Colonoscopy Indications:              Screening in patient at increased risk: Colorectal                            cancer in father before age 108 Medicines:                Monitored Anesthesia Care Procedure:                Pre-Anesthesia Assessment:                           - Prior to the procedure, a History and Physical                            was performed, and patient medications and                            allergies were reviewed. The patient's tolerance of                            previous anesthesia was also reviewed. The risks                            and benefits of the procedure and the sedation                            options and risks were discussed with the patient.                            All questions were answered, and informed consent                            was obtained. Prior Anticoagulants: The patient has                            taken no anticoagulant or antiplatelet agents. ASA                            Grade Assessment: II - A patient with mild systemic                            disease. After reviewing the risks and benefits,                            the patient was deemed in satisfactory condition to                            undergo the procedure.  After obtaining informed consent, the colonoscope                            was passed under direct vision. Throughout the                            procedure, the patient's blood pressure, pulse, and                            oxygen saturations were monitored continuously. The                            [id number]-HQ190L Colonoscope [id number] was introduced                            through the anus and  advanced to the the cecum,                            identified by appendiceal orifice and ileocecal                            valve. The colonoscopy was performed without                            difficulty. The patient tolerated the procedure                            well. The quality of the bowel preparation was                            good. The ileocecal valve, appendiceal orifice, and                            rectum were photographed. Scope In: 11:31:31 AM Scope Out: 11:41:17 AM Scope Withdrawal Time: 0 hours 7 minutes 17 seconds  Total Procedure Duration: 0 hours 9 minutes 46 seconds  Findings:                 The perianal and digital rectal examinations were                            normal.                           Scattered medium-mouthed and small-mouthed                            diverticula were found in the sigmoid colon,                            descending colon, transverse colon and ascending                            colon. Peri-diverticular erythema was seen.  Non-bleeding external and internal hemorrhoids were                            found during retroflexion. The hemorrhoids were                            medium-sized. Complications:            No immediate complications. Estimated blood loss:                            None. Impression:               - Moderate diverticulosis in the sigmoid colon, in                            the descending colon, in the transverse colon and                            in the ascending colon. Peri-diverticular erythema                            was seen.                           - Non-bleeding external and internal hemorrhoids.                           - No specimens collected. Recommendation:           - Patient has a contact number available for                            emergencies. The signs and symptoms of potential                            delayed complications were discussed  with the                            patient. Return to normal activities tomorrow.                            Written discharge instructions were provided to the                            patient.                           - Resume previous diet.                           - Continue present medications.                           - Repeat colonoscopy in 5 years for surveillance. Napoleon Form, MD 09/26/2023 11:45:41 AM This report has been signed electronically.

## 2023-09-26 NOTE — Patient Instructions (Signed)
Handouts provided on diverticulosis and hemorrhoids.  Resume previous diet.  Continue present medications.  Repeat colonoscopy in 5 years for surveillance.   YOU HAD AN ENDOSCOPIC PROCEDURE TODAY AT THE [facility]:   Refer to the procedure report that was given to you for any specific questions about what was found during the examination.  If the procedure report does not answer your questions, please call your gastroenterologist to clarify.  If you requested that your care partner not be given the details of your procedure findings, then the procedure report has been included in a sealed envelope for you to review at your convenience later.  YOU SHOULD EXPECT: Some feelings of bloating in the abdomen. Passage of more gas than usual.  Walking can help get rid of the air that was put into your GI tract during the procedure and reduce the bloating. If you had a lower endoscopy (such as a colonoscopy or flexible sigmoidoscopy) you may notice spotting of blood in your stool or on the toilet paper. If you underwent a bowel prep for your procedure, you may not have a normal bowel movement for a few days.  Please Note:  You might notice some irritation and congestion in your nose or some drainage.  This is from the oxygen used during your procedure.  There is no need for concern and it should clear up in a day or so.  SYMPTOMS TO REPORT IMMEDIATELY:  Following lower endoscopy (colonoscopy or flexible sigmoidoscopy):  Excessive amounts of blood in the stool  Significant tenderness or worsening of abdominal pains  Swelling of the abdomen that is new, acute  Fever of 100F or higher  For urgent or emergent issues, a gastroenterologist can be reached at any hour by calling [phone]. Do not use MyChart messaging for urgent concerns.    DIET:  We do recommend a small meal at first, but then you may proceed to your regular diet.  Drink plenty of fluids but you should avoid alcoholic  beverages for 24 hours.  ACTIVITY:  You should plan to take it easy for the rest of today and you should NOT DRIVE or use heavy machinery until tomorrow (because of the sedation medicines used during the test).    FOLLOW UP: Our staff will call the number listed on your records the next business day following your procedure.  We will call around 7:15- 8:00 am to check on you and address any questions or concerns that you may have regarding the information given to you following your procedure. If we do not reach you, we will leave a message.     If any biopsies were taken you will be contacted by phone or by letter within the next 1-3 weeks.  Please call us at [phone] if you have not heard about the biopsies in 3 weeks.    SIGNATURES/CONFIDENTIALITY: You and/or your care partner have signed paperwork which will be entered into your electronic medical record.  These signatures attest to the fact that that the information above on your After Visit Summary has been reviewed and is understood.  Full responsibility of the confidentiality of this discharge information lies with you and/or your care-partner.

## 2023-09-26 NOTE — Progress Notes (Signed)
Pt's states no medical or surgical changes since previsit or office visit. 

## 2023-09-26 NOTE — Progress Notes (Signed)
[facility] History and Physical   Primary Care Physician:  Jeani Sow, MD   Reason for Procedure:  Family history of colon cancer  Plan:    Screening colonoscopy with possible interventions as needed     HPI: Cassandra Knight is a very pleasant 72 y.o. female here for screening colonoscopy. Denies any nausea, vomiting, abdominal pain, melena or bright red blood per rectum  The risks and benefits as well as alternatives of endoscopic procedure(s) have been discussed and reviewed. All questions answered. The patient agrees to proceed.    Past Medical History:  Diagnosis Date   Allergy    Arthritis    Hypertension    Melanoma (HCC)    R upper arm   Osteopenia    Osteoporosis     Past Surgical History:  Procedure Laterality Date   BREAST EXCISIONAL BIOPSY Right    x 2, 2001 and 2003-took tomoxifen for 5 yrs and fosamax   broken 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   COLONOSCOPY  2013   EYE SURGERY Bilateral 2006   lasix   MELANOMA EXCISION Right 2018   right upper arm   SALPINGOOPHORECTOMY Right 2002   TUBAL LIGATION  1984    Prior to Admission medications   Medication Sig Start Date End Date Taking? Authorizing Provider  losartan (COZAAR) 100 MG tablet Take 1 tablet (100 mg total) by mouth daily. 09/15/23  Yes Jeani Sow, MD  ondansetron (ZOFRAN) 4 MG tablet Take 1 tablet (4 mg total) by mouth as directed. Take one Zofran 4 mg tablet 30-60 minutes before each prep dose 08/29/23  Yes Joseh Sjogren, Eleonore Chiquito, MD  acetaminophen (TYLENOL) 325 MG tablet Take 650 mg by mouth every 6 (six) hours as needed.    [provider]  Calcium-Phosphorus-Vitamin D (CALCIUM GUMMIES PO) Take 2 tablets by mouth daily. VitaFusion Calcium    [provider]  Homeopathic Products (ZICAM ALLERGY RELIEF NA) Place 2 sprays into the nose as needed.    [provider]  ibuprofen (ADVIL,MOTRIN) 600 MG tablet Take 1 tablet (600 mg total) by  mouth every 8 (eight) hours as needed. 09/29/15   Azalia Bilis, MD  Misc Natural Products (SAMBUCUS ELDERBERRY IMMUNE) CHEW Chew 1 tablet by mouth daily. Patient not taking: Reported on 09/26/2023    [provider]  Multiple Vitamin (MULTIVITAMIN ADULT PO) Take 2 tablets by mouth daily. 2 gummies by mouth daily, VitaFusion Multivitamin    [provider]  UNABLE TO FIND Take 20 g by mouth daily. Med Name: Vital Proteins Collagen Peptides 4 tbsp daily    [provider]    Current Outpatient Medications  Medication Sig Dispense Refill   losartan (COZAAR) 100 MG tablet Take 1 tablet (100 mg total) by mouth daily. 90 tablet 3   ondansetron (ZOFRAN) 4 MG tablet Take 1 tablet (4 mg total) by mouth as directed. Take one Zofran 4 mg tablet 30-60 minutes before each prep dose 2 tablet 0   acetaminophen (TYLENOL) 325 MG tablet Take 650 mg by mouth every 6 (six) hours as needed.     Calcium-Phosphorus-Vitamin D (CALCIUM GUMMIES PO) Take 2 tablets by mouth daily. VitaFusion Calcium     Homeopathic Products (ZICAM ALLERGY RELIEF NA) Place 2 sprays into the nose as needed.     ibuprofen (ADVIL,MOTRIN) 600 MG tablet Take 1 tablet (600 mg total) by mouth every 8 (eight) hours as needed. 15 tablet 0   Misc Natural  Products (SAMBUCUS ELDERBERRY IMMUNE) CHEW Chew 1 tablet by mouth daily. (Patient not taking: Reported on 09/26/2023)     Multiple Vitamin (MULTIVITAMIN ADULT PO) Take 2 tablets by mouth daily. 2 gummies by mouth daily, VitaFusion Multivitamin     UNABLE TO FIND Take 20 g by mouth daily. Med Name: Vital Proteins Collagen Peptides 4 tbsp daily     Current Facility-Administered Medications  Medication Dose Route Frequency Provider Last Rate Last Admin   0.9 %  sodium chloride infusion  500 mL Intravenous Once Napoleon Form, MD        Allergies as of 09/26/2023   (No Known Allergies)    Family History  Problem Relation Age of Onset   Hypertension Mother    Cancer  Mother    Arthritis Mother    Breast cancer Mother    Colon polyps Father    Cancer Father    Early death Father    Colon cancer Father    Asthma Sister    Arthritis Sister    Arthritis Brother    Early death Maternal Grandfather    Heart attack Maternal Grandfather    Esophageal cancer Neg Hx    Stomach cancer Neg Hx    Rectal cancer Neg Hx     Social History   Socioeconomic History   Marital status: Married    Spouse name: Not on file   Number of children: 3   Years of education: Not on file   Highest education level: Associate degree: occupational, Scientist, product/process development, or vocational program  Occupational History   Not on file  Tobacco Use   Smoking status: Former    Types: Cigarettes   Smokeless tobacco: Never   Tobacco comments:    Smoked for 4 years,in her teens half pack or so  Vaping Use   Vaping status: Never Used  Substance and Sexual Activity   Alcohol use: Yes    Alcohol/week: 3.0 - 5.0 standard drinks of alcohol    Types: 3 - 5 Glasses of wine per week    Comment: 1/d not daily   Drug use: Not Currently    Types: Marijuana    Comment: used in the past-teens   Sexual activity: Not Currently  Other Topics Concern   Not on file  Social History Narrative   3 grands   Retired Clinical biochemist   Social Determinants of Health   Financial Resource Strain: Low Risk  (09/25/2023)   Overall Financial Resource Strain (CARDIA)    Difficulty of Paying Living Expenses: Not hard at all  Food Insecurity: No Food Insecurity (09/25/2023)   Hunger Vital Sign    Worried About Running Out of Food in the Last Year: Never true    Ran Out of Food in the Last Year: Never true  Transportation Needs: No Transportation Needs (09/25/2023)   PRAPARE - Administrator, Civil Service (Medical): No    Lack of Transportation (Non-Medical): No  Physical Activity: Sufficiently Active (09/25/2023)   Exercise Vital Sign    Days of Exercise per Week: 5 days    Minutes of Exercise  per Session: 30 min  Stress: No Stress Concern Present (09/25/2023)   [employer] of Occupational Health - Occupational Stress Questionnaire    Feeling of Stress : Not at all  Social Connections: Moderately Integrated (09/25/2023)   Social Connection and Isolation Panel [NHANES]    Frequency of Communication with Friends and Family: More than three times a week    Frequency  of Social Gatherings with Friends and Family: More than three times a week    Attends Religious Services: Never    Database administrator or Organizations: Yes    Attends Banker Meetings: 1 to 4 times per year    Marital Status: Married  Catering manager Violence: Not At Risk (09/25/2023)   Humiliation, Afraid, Rape, and Kick questionnaire    Fear of Current or Ex-Partner: No    Emotionally Abused: No    Physically Abused: No    Sexually Abused: No    Review of Systems:  All other review of systems negative except as mentioned in the HPI.  Physical Exam: Vital signs in last 24 hours: BP (!) 149/93   Pulse 90   Temp 97.9 F (36.6 C)   Resp 16   Ht 4\' 11"  (1.499 m)   Wt 134 lb (60.8 kg)   SpO2 100%   BMI 27.06 kg/m  General:   Alert, NAD Lungs:  Clear .   Heart:  Regular rate and rhythm Abdomen:  Soft, nontender and nondistended. Neuro/Psych:  Alert and cooperative. Normal mood and affect. A and O x 3  Reviewed labs, radiology imaging, old records and pertinent past GI work up  Patient is appropriate for planned procedure(s) and anesthesia in an ambulatory setting   K. Scherry Ran , MD [phone]

## 2023-09-26 NOTE — Progress Notes (Signed)
Vss nad trans to pacu 

## 2023-09-27 ENCOUNTER — Telehealth: Payer: Self-pay

## 2023-09-27 NOTE — Telephone Encounter (Signed)
  Follow up Call-     09/26/2023   11:05 AM  Call back number  Post procedure Call Back phone  # [phone]  Permission to leave phone message Yes     Patient questions:  Do you have a fever, pain , or abdominal swelling? No. Pain Score  0 *  Have you tolerated food without any problems? Yes.    Have you been able to return to your normal activities? Yes.    Do you have any questions about your discharge instructions: Diet   No. Medications  No. Follow up visit  No.  Do you have questions or concerns about your Care? No.  Actions: * If pain score is 4 or above: No action needed, pain <4.

## 2023-10-30 ENCOUNTER — Other Ambulatory Visit: Payer: Self-pay | Admitting: Obstetrics and Gynecology

## 2023-10-30 DIAGNOSIS — Z0000 Encounter for general adult medical examination without abnormal findings: Secondary | ICD-10-CM

## 2023-11-22 ENCOUNTER — Ambulatory Visit (HOSPITAL_BASED_OUTPATIENT_CLINIC_OR_DEPARTMENT_OTHER): Payer: Medicare Other

## 2023-11-22 ENCOUNTER — Ambulatory Visit (HOSPITAL_BASED_OUTPATIENT_CLINIC_OR_DEPARTMENT_OTHER): Payer: Medicare Other | Admitting: Cardiology

## 2023-11-22 VITALS — BP 144/82 | HR 94 | Ht 60.5 in | Wt 138.6 lb

## 2023-11-22 DIAGNOSIS — I493 Ventricular premature depolarization: Secondary | ICD-10-CM | POA: Diagnosis not present

## 2023-11-22 DIAGNOSIS — R9431 Abnormal electrocardiogram [ECG] [EKG]: Secondary | ICD-10-CM | POA: Diagnosis not present

## 2023-11-22 DIAGNOSIS — Z7189 Other specified counseling: Secondary | ICD-10-CM

## 2023-11-22 NOTE — Patient Instructions (Signed)
Medication Instructions:  Your physician recommends that you continue on your current medications as directed. Please refer to the Current Medication list given to you today.   Testing/Procedures: Your physician has recommended that you wear a Zio monitor.   This monitor is a medical device that records the heart's electrical activity. Doctors most often use these monitors to diagnose arrhythmias. Arrhythmias are problems with the speed or rhythm of the heartbeat. The monitor is a small device applied to your chest. You can wear one while you do your normal daily activities. While wearing this monitor if you have any symptoms to push the button and record what you felt. Once you have worn this monitor for the period of time provider prescribed (Usually 14 days), you will return the monitor device in the postage paid box. Once it is returned they will download the data collected and provide [place] with a report which the provider will then review and we will call you with those results. Important tips:  Avoid showering during the first 24 hours of wearing the monitor. Avoid excessive sweating to help maximize wear time. Do not submerge the device, no hot tubs, and no swimming pools. Keep any lotions or oils away from the patch. After 24 hours you may shower with the patch on. Take brief showers with your back facing the shower head.  Do not remove patch once it has been placed because that will interrupt data and decrease adhesive wear time. Push the button when you have any symptoms and write down what you were feeling. Once you have completed wearing your monitor, remove and place into box which has postage paid and place in your outgoing mailbox.  If for some reason you have misplaced your box then call our office and we can provide another box and/or mail it off for you.   Follow-Up: At [facility], you and your health needs are our priority.  As part of our continuing mission to  provide you with exceptional heart care, we have created designated Provider Care Teams.  These Care Teams include your primary Cardiologist (physician) and Advanced Practice Providers (APPs -  Physician Assistants and Nurse Practitioners) who all work together to provide you with the care you need, when you need it.  We recommend signing up for the patient portal called "MyChart".  Sign up information is provided on this After Visit Summary.  MyChart is used to connect with patients for Virtual Visits (Telemedicine).  Patients are able to view lab/test results, encounter notes, upcoming appointments, etc.  Non-urgent messages can be sent to your provider as well.   To learn more about what you can do with MyChart, go to [email or web address].    Your next appointment:   To be determined based on your heart monitor results.  Provider:   Jodelle Red, MD    Other Instructions

## 2023-11-22 NOTE — Progress Notes (Unsigned)
Cardiology Office Note:  .   Date:  11/22/2023  ID:  [name], DOB April 15, 1951, MRN [id number] PCP: Jeani Sow, MD  [facility] Providers Cardiologist:  Jodelle Red, MD {  History of Present Illness: .   Cassandra Knight is a 73 y.o. female with Pmh hypertension. She was referred for abnormal ECG/bigeminy at the request of Dr. Ruthine Dose.  Today: Reviewed notes/ECG from Dr. Ruthine Dose. ECG 09/15/23 ventricular bigeminy at 94 bpm. Asymptomatic. Reviewed lab workup. Referred to cardiology for further evaluation.  She is asymptomatic, was not having any symptoms with her ECG at the time. Able to exercise, likes to walk outside. No limitations. Walks at least 30 minutes at a good pace. Has elliptical she uses as well. She has no personal history of heart issues beyond hypertension; started meds for hypertension in 2023. Always has white coat component. Numbers at home 120s-130/70s-80s.  Also told she was having early beats at her colonoscopy recently.  FH: mother had high blood pressure, has unclear valve issue but did not need surgery for it. Maternal grandfather had MI in his 4s. No other early history of heart issues/sudden death.  ROS: Denies chest pain, shortness of breath at rest or with normal exertion. No PND, orthopnea, LE edema or unexpected weight gain. No syncope or palpitations. ROS otherwise negative except as noted.   Studies Reviewed: Marland Kitchen    EKG:       Physical Exam:   VS:  BP (!) 146/88   Pulse 94   Ht 5' 0.5" (1.537 m)   Wt 138 lb 9.6 oz (62.9 kg)   SpO2 98%   BMI 26.62 kg/m    Wt Readings from Last 3 Encounters:  11/22/23 138 lb 9.6 oz (62.9 kg)  09/26/23 134 lb (60.8 kg)  09/25/23 135 lb (61.2 kg)    GEN: Well nourished, well developed in no acute distress HEENT: Normal, moist mucous membranes NECK: No JVD CARDIAC: regular rhythm, normal S1 and S2, no rubs or gallops. No murmur. VASCULAR: Radial and DP pulses 2+ bilaterally. No carotid  bruits RESPIRATORY:  Clear to auscultation without rales, wheezing or rhonchi  ABDOMEN: Soft, non-tender, non-distended MUSCULOSKELETAL:  Ambulates independently SKIN: Warm and dry, no edema NEUROLOGIC:  Alert and oriented x 3. No focal neuro deficits noted. PSYCHIATRIC:  Normal affect    ASSESSMENT AND PLAN: .    Abnormal ECG PVC Bigeminy -will 2 week Zio to evaluate burden -if abnormal, would get echo and discuss beta blocker vs calcium channel blocker  Hypertension, with component of white coat syndrome  CV risk counseling and prevention -recommend heart healthy/Mediterranean diet, with whole grains, fruits, vegetable, fish, lean meats, nuts, and olive oil. Limit salt. -recommend moderate walking, 3-5 times/week for 30-50 minutes each session. Aim for at least 150 minutes.week. Goal should be pace of 3 miles/hours, or walking 1.5 miles in 30 minutes -recommend avoidance of tobacco products. Avoid excess alcohol. -ASCVD risk score: The 10-year ASCVD risk score (Arnett DK, et al., 2019) is: 16.1%   Values used to calculate the score:     Age: 24 years     Sex: Female     Is Non-Hispanic African American: No     Diabetic: No     Tobacco smoker: No     Systolic Blood Pressure: 146 mmHg     Is BP treated: Yes     HDL Cholesterol: 50.8 mg/dL     Total Cholesterol: 190 mg/dL    Dispo: to  be determined based on results of testing  Signed, Jodelle Red, MD   Jodelle Red, MD, PhD, [facility]  [facility]    Heart & Vascular at [facility] at [facility] [address], [address] [place], [place] [address] [phone]

## 2023-12-03 ENCOUNTER — Encounter (HOSPITAL_BASED_OUTPATIENT_CLINIC_OR_DEPARTMENT_OTHER): Payer: Self-pay | Admitting: Cardiology

## 2023-12-07 ENCOUNTER — Ambulatory Visit
Admission: RE | Admit: 2023-12-07 | Discharge: 2023-12-07 | Disposition: A | Discharge status: Home / Self Care | Payer: Medicare Other | Source: Ambulatory Visit | Attending: Obstetrics and Gynecology | Admitting: Obstetrics and Gynecology

## 2023-12-07 DIAGNOSIS — Z1231 Encounter for screening mammogram for malignant neoplasm of breast: Secondary | ICD-10-CM | POA: Diagnosis not present

## 2023-12-07 DIAGNOSIS — Z0000 Encounter for general adult medical examination without abnormal findings: Secondary | ICD-10-CM

## 2023-12-11 ENCOUNTER — Encounter: Payer: Self-pay | Admitting: Family Medicine

## 2023-12-13 DIAGNOSIS — I493 Ventricular premature depolarization: Secondary | ICD-10-CM | POA: Diagnosis not present

## 2024-01-02 DIAGNOSIS — K080 Exfoliation of teeth due to systemic causes: Secondary | ICD-10-CM | POA: Diagnosis not present

## 2024-01-07 ENCOUNTER — Encounter (HOSPITAL_BASED_OUTPATIENT_CLINIC_OR_DEPARTMENT_OTHER): Payer: Self-pay

## 2024-02-09 ENCOUNTER — Encounter (HOSPITAL_BASED_OUTPATIENT_CLINIC_OR_DEPARTMENT_OTHER): Payer: Self-pay

## 2024-02-09 DIAGNOSIS — I493 Ventricular premature depolarization: Secondary | ICD-10-CM | POA: Diagnosis not present

## 2024-04-03 DIAGNOSIS — K080 Exfoliation of teeth due to systemic causes: Secondary | ICD-10-CM | POA: Diagnosis not present

## 2024-07-10 DIAGNOSIS — K080 Exfoliation of teeth due to systemic causes: Secondary | ICD-10-CM | POA: Diagnosis not present

## 2024-08-07 DIAGNOSIS — Z86018 Personal history of other benign neoplasm: Secondary | ICD-10-CM | POA: Diagnosis not present

## 2024-08-07 DIAGNOSIS — L814 Other melanin hyperpigmentation: Secondary | ICD-10-CM | POA: Diagnosis not present

## 2024-08-07 DIAGNOSIS — Z85820 Personal history of malignant melanoma of skin: Secondary | ICD-10-CM | POA: Diagnosis not present

## 2024-08-07 DIAGNOSIS — L821 Other seborrheic keratosis: Secondary | ICD-10-CM | POA: Diagnosis not present

## 2024-08-09 NOTE — Progress Notes (Signed)
 Cassandra Knight                                          MRN: [id number]   08/09/2024   The [facility]I Quality Team Specialist reviewed this patient medical record for the purposes of chart review for care gap closure. The following were reviewed: chart review for care gap closure-controlling blood pressure. BP 144/82    VBCI Quality Team

## 2024-09-13 ENCOUNTER — Ambulatory Visit: Payer: Medicare Other | Admitting: Family Medicine

## 2024-09-13 ENCOUNTER — Ambulatory Visit: Payer: Self-pay | Admitting: Family Medicine

## 2024-09-13 ENCOUNTER — Encounter: Payer: Self-pay | Admitting: Family Medicine

## 2024-09-13 VITALS — BP 146/88 | HR 78 | Temp 97.8°F | Ht 60.5 in | Wt 137.1 lb

## 2024-09-13 DIAGNOSIS — M816 Localized osteoporosis [Lequesne]: Secondary | ICD-10-CM | POA: Diagnosis not present

## 2024-09-13 DIAGNOSIS — R9431 Abnormal electrocardiogram [ECG] [EKG]: Secondary | ICD-10-CM | POA: Diagnosis not present

## 2024-09-13 DIAGNOSIS — I10 Essential (primary) hypertension: Secondary | ICD-10-CM | POA: Diagnosis not present

## 2024-09-13 LAB — CBC WITH DIFFERENTIAL/PLATELET
Basophils Absolute: 0 K/uL (ref 0.0–0.1)
Basophils Relative: 0.6 % (ref 0.0–3.0)
Eosinophils Absolute: 0.1 K/uL (ref 0.0–0.7)
Eosinophils Relative: 1.7 % (ref 0.0–5.0)
HCT: 39.9 % (ref 36.0–46.0)
Hemoglobin: 13.8 g/dL (ref 12.0–15.0)
Lymphocytes Relative: 22.8 % (ref 12.0–46.0)
Lymphs Abs: 1.3 K/uL (ref 0.7–4.0)
MCHC: 34.6 g/dL (ref 30.0–36.0)
MCV: 98.8 fl (ref 78.0–100.0)
Monocytes Absolute: 0.4 K/uL (ref 0.1–1.0)
Monocytes Relative: 7.8 % (ref 3.0–12.0)
Neutro Abs: 3.7 K/uL (ref 1.4–7.7)
Neutrophils Relative %: 67.1 % (ref 43.0–77.0)
Platelets: 225 K/uL (ref 150.0–400.0)
RBC: 4.04 Mil/uL (ref 3.87–5.11)
RDW: 12.3 % (ref 11.5–15.5)
WBC: 5.5 K/uL (ref 4.0–10.5)

## 2024-09-13 LAB — COMPREHENSIVE METABOLIC PANEL WITH GFR
ALT: 13 U/L (ref 0–35)
AST: 19 U/L (ref 0–37)
Albumin: 4.4 g/dL (ref 3.5–5.2)
Alkaline Phosphatase: 111 U/L (ref 39–117)
BUN: 18 mg/dL (ref 6–23)
CO2: 28 meq/L (ref 19–32)
Calcium: 9.6 mg/dL (ref 8.4–10.5)
Chloride: 105 meq/L (ref 96–112)
Creatinine, Ser: 0.62 mg/dL (ref 0.40–1.20)
GFR: 89.83 mL/min (ref >60.00)
Glucose, Bld: 95 mg/dL (ref 70–99)
Potassium: 3.9 meq/L (ref 3.5–5.1)
Sodium: 142 meq/L (ref 135–145)
Total Bilirubin: 0.6 mg/dL (ref 0.2–1.2)
Total Protein: 7.2 g/dL (ref 6.0–8.3)

## 2024-09-13 LAB — LIPID PANEL
Cholesterol: 173 mg/dL (ref 0–200)
HDL: 54.3 mg/dL (ref >39.00)
LDL Cholesterol: 96 mg/dL (ref 0–99)
NonHDL: 118.2
Total CHOL/HDL Ratio: 3
Triglycerides: 112 mg/dL (ref 0.0–149.0)
VLDL: 22.4 mg/dL (ref 0.0–40.0)

## 2024-09-13 LAB — VITAMIN D 25 HYDROXY (VIT D DEFICIENCY, FRACTURES): VITD: 45.69 ng/mL (ref 30.00–100.00)

## 2024-09-13 LAB — TSH: TSH: 0.73 u[IU]/mL (ref 0.35–5.50)

## 2024-09-13 MED ORDER — LOSARTAN POTASSIUM 100 MG PO TABS: 100.0000 mg | ORAL_TABLET | Freq: Every day | ORAL | 3 refills | Status: AC

## 2024-09-13 NOTE — Patient Instructions (Signed)
 It was very nice to see you today!  Get bone density scheduled   PLEASE NOTE:  If you had any lab tests please let us  know if you have not heard back within a few days. You may see your results on MyChart before we have a chance to review them but we will give you a call once they are reviewed by us . If we ordered any referrals today, please let us  know if you have not heard from their office within the next week.   Please try these tips to maintain a healthy lifestyle:  Eat most of your calories during the day when you are active. Eliminate processed foods including packaged sweets (pies, cakes, cookies), reduce intake of potatoes, white bread, white pasta, and white rice. Look for whole grain options, oat flour or almond flour.  Each meal should contain half fruits/vegetables, one quarter protein, and one quarter carbs (no bigger than a computer mouse).  Cut down on sweet beverages. This includes juice, soda, and sweet tea. Also watch fruit intake, though this is a healthier sweet option, it still contains natural sugar! Limit to 3 servings daily.  Drink at least 1 glass of water with each meal and aim for at least 8 glasses per day  Exercise at least 150 minutes every week.

## 2024-09-13 NOTE — Progress Notes (Signed)
 Subjective:     Patient ID: Cassandra Knight, female    DOB: October 07, 1951, 73 y.o.   MRN: [id number]  Chief Complaint  Patient presents with   Hypertension    Pt is here for HTN    Discussed the use of AI scribe software for clinical note transcription with the patient, who gave verbal consent to proceed.  History of Present Illness Cassandra Knight is a 73 year old female with osteoporosis and hypertension who presents for follow-up.  She is actively managing her osteoporosis through daily physical activities, including walking, using stairs, dancing, and performing arm weight exercises three to four times a week. She utilizes her long driveway with a hill for exercise. She takes calcium and vitamin D  supplements but has concerns about osteoporosis medication due to a dental implant and potential jaw problems. She experiences hip pain after prolonged sitting, which improves with movement. Did do fosamax in past  For hypertension, she is taking losartan  100 mg. Her home blood pressure readings range from 110s to 135 over 60s to 80s, averaging 120s over 70s. Her pulse ranges from 70 to 106, with higher readings post-exercise. Her blood pressure readings are higher during office visits compared to home measurements.  She has a family history of heart murmurs, with her sister, brother, and father all diagnosed. She has reduced alcohol intake, abstaining for the past two weeks to improve sleep quality.  She wore a heart monitor for two weeks due to concerns about ectopic beats, but results showed no significant findings. No chest pain, heart racing, skipping beats, coughing, shortness of breath, vomiting, or diarrhea.    Health Maintenance Due  Topic Date Due   Medicare Annual Wellness (AWV)  09/24/2024    Past Medical History:  Diagnosis Date   Allergy    Arthritis    Hypertension    Melanoma (HCC)    R upper arm   Osteopenia    Osteoporosis     Past Surgical History:   Procedure Laterality Date   BREAST EXCISIONAL BIOPSY Right    x 2, 2001 and 2003-took tomoxifen for 5 yrs and fosamax   broken 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   COLONOSCOPY  2013   EYE SURGERY Bilateral 2006   lasix   MELANOMA EXCISION Right 2018   right upper arm   SALPINGOOPHORECTOMY Right 2002   TUBAL LIGATION  1984     Current Outpatient Medications:    acetaminophen (TYLENOL) 325 MG tablet, Take 650 mg by mouth every 6 (six) hours as needed., Disp: , Rfl:    Calcium-Phosphorus-Vitamin D  (CALCIUM GUMMIES PO), Take 2 tablets by mouth daily. VitaFusion Calcium, Disp: , Rfl:    Homeopathic Products (ZICAM ALLERGY RELIEF NA), Place 2 sprays into the nose as needed., Disp: , Rfl:    ibuprofen  (ADVIL ,MOTRIN ) 600 MG tablet, Take 1 tablet (600 mg total) by mouth every 8 (eight) hours as needed., Disp: 15 tablet, Rfl: 0   Misc Natural Products (SAMBUCUS ELDERBERRY IMMUNE) CHEW, Chew 1 tablet by mouth daily., Disp: , Rfl:    Multiple Vitamin (MULTIVITAMIN ADULT PO), Take 2 tablets by mouth daily. 2 gummies by mouth daily, VitaFusion Multivitamin, Disp: , Rfl:    losartan  (COZAAR ) 100 MG tablet, Take 1 tablet (100 mg total) by mouth daily., Disp: 90 tablet, Rfl: 3  No Known Allergies ROS neg/noncontributory except as noted HPI/below      Objective:     BP (!) 146/88  Pulse 78   Temp 97.8 F (36.6 C) (Temporal)   Ht 5' 0.5 (1.537 m)   Wt 137 lb 2 oz (62.2 kg)   SpO2 98%   BMI 26.34 kg/m  Wt Readings from Last 3 Encounters:  09/13/24 137 lb 2 oz (62.2 kg)  11/22/23 138 lb 9.6 oz (62.9 kg)  09/26/23 134 lb (60.8 kg)    Physical Exam VITALS: BP- 142/ GENERAL: Well developed, well nourished, no acute distress. HEAD EYES EARS NOSE THROAT: Normocephalic, atraumatic, conjunctiva not injected, sclera nonicteric. CARDIAC: Regular rate and rhythm w/freq ectopic beats, S1 S2 present, no murmur, dorsalis pedis 2 plus bilaterally. NECK: Supple, no thyromegaly, no  nodes, no carotid bruits. LUNGS: Clear to auscultation bilaterally, no wheezes. ABDOMEN: Bowel sounds present, soft, non-tender, non-distended, no hepatosplenomegaly, no masses. EXTREMITIES: No edema. MUSCULOSKELETAL: No gross abnormalities. NEUROLOGICAL: Alert and oriented x3, cranial nerves II through XII intact. PSYCHIATRIC: Normal mood, good eye contact.       Assessment & Plan:  Primary hypertension -     Losartan  Potassium; Take 1 tablet (100 mg total) by mouth daily.  Dispense: 90 tablet; Refill: 3 -     CBC with Differential/Platelet -     Comprehensive metabolic panel with GFR -     Lipid panel -     TSH  Abnormal EKG -     TSH  Localized osteoporosis without current pathological fracture -     DG Bone Density; Future -     TSH -     VITAMIN D  25 Hydroxy (Vit-D Deficiency, Fractures)    Assessment and Plan Assessment & Plan Osteoporosis involving multiple sites   Osteoporosis affects her spine, femur, and forearm with a T-score of -3.0. She hesitates to start medication due to potential jaw issues, especially with dental implants. She remains active, engaging in weight-bearing exercises, which benefit bone health. Occasional hip pain occurs, possibly unrelated to osteoporosis. A bone density scan is ordered to assess the current status. Continue calcium and vitamin D  supplementation and encourage regular weight-bearing exercise.  Essential hypertension   Her blood pressure is well-controlled with losartan  100 mg. Home readings average in the 120s/70s, with an in-office reading of 142 likely due to white coat syndrome. She reports no dizziness, chest pain, or palpitations. Renewed losartan  prescription at [facility] and continue home blood pressure monitoring.  General Health Maintenance   She maintains regular physical activity, including walking and weight-bearing exercises, supporting overall health and osteoporosis management. Continue calcium and vitamin D   supplementation.     Return in about 1 year (around 09/13/2025) for annual physical, HTN.  Cassandra CHRISTELLA Carrel, MD

## 2024-09-13 NOTE — Progress Notes (Signed)
 great

## 2024-10-02 ENCOUNTER — Ambulatory Visit: Payer: Medicare Other

## 2024-10-02 VITALS — BP 131/73 | Ht 59.0 in | Wt 137.0 lb

## 2024-10-02 DIAGNOSIS — Z0000 Encounter for general adult medical examination without abnormal findings: Secondary | ICD-10-CM | POA: Diagnosis not present

## 2024-10-02 NOTE — Progress Notes (Signed)
 Chief Complaint  Patient presents with   Medicare Wellness     Subjective:   Cassandra Knight is a 73 y.o. female who presents for a Medicare Annual Wellness Visit.  Visit info / Clinical Intake: Medicare Wellness Visit Type:: Subsequent Annual Wellness Visit Persons participating in visit and providing information:: patient Medicare Wellness Visit Mode:: Video Since this visit was completed virtually, some vitals may be partially provided or unavailable. Missing vitals are due to the limitations of the virtual format.: Documented vitals are patient reported If Telephone or Video please confirm:: I connected with patient using audio/video enable telemedicine. I verified patient identity with two identifiers, discussed telehealth limitations, and patient agreed to proceed. Patient Location:: home Provider Location:: home office Interpreter Needed?: No Pre-visit prep was completed: yes AWV questionnaire completed by patient prior to visit?: no Living arrangements:: lives with spouse/significant other Patient's Overall Health Status Rating: very good Typical amount of pain: some Does pain affect daily life?: no Are you currently prescribed opioids?: no  Dietary Habits and Nutritional Risks How many meals a day?: 3 Eats fruit and vegetables daily?: yes Most meals are obtained by: preparing own meals; eating out Diabetic:: no  Functional Status Activities of Daily Living (to include ambulation/medication): Independent Ambulation: Independent with device- listed below Home Assistive Devices/Equipment: Eyeglasses Medication Administration: Independent Home Management (perform basic housework or laundry): Independent Manage your own finances?: yes Primary transportation is: driving Concerns about vision?: no *vision screening is required for WTM* Concerns about hearing?: no  Fall Screening Falls in the past year?: 0 Number of falls in past year: 0 Was there an injury with  Fall?: 0 Fall Risk Category Calculator: 0 Patient Fall Risk Level: Low Fall Risk  Fall Risk Patient at Risk for Falls Due to: No Fall Risks Fall risk Follow up: Falls evaluation completed  Home and Transportation Safety: All rugs have non-skid backing?: yes All stairs or steps have railings?: yes Grab bars in the bathtub or shower?: yes Have non-skid surface in bathtub or shower?: yes Good home lighting?: yes Regular seat belt use?: yes Hospital stays in the last year:: no  Cognitive Assessment Difficulty concentrating, remembering, or making decisions? : no Will 6CIT or Mini Cog be Completed: yes What year is it?: 0 points What month is it?: 0 points Give patient an address phrase to remember (5 components): [address] [place]  About what time is it?: 0 points Count backwards from 20 to 1: 0 points Say the months of the year in reverse: 0 points Repeat the address phrase from earlier: 0 points 6 CIT Score: 0 points  Advance Directives (For Healthcare) Does Patient Have a Medical Advance Directive?: Yes Type of Advance Directive: Healthcare Power of Attorney Copy of Healthcare Power of Attorney in Chart?: Yes - validated most recent copy scanned in chart (See row information) (01/2023)  Reviewed/Updated  Reviewed/Updated: Reviewed All (Medical, Surgical, Family, Medications, Allergies, Care Teams, Patient Goals)    Allergies (verified) Patient has no known allergies.   Current Medications (verified) Outpatient Encounter Medications as of 10/02/2024  Medication Sig   acetaminophen (TYLENOL) 325 MG tablet Take 650 mg by mouth every 6 (six) hours as needed.   Calcium-Phosphorus-Vitamin D  (CALCIUM GUMMIES PO) Take 2 tablets by mouth daily. VitaFusion Calcium   Homeopathic Products (ZICAM ALLERGY RELIEF NA) Place 2 sprays into the nose as needed.   ibuprofen  (ADVIL ,MOTRIN ) 600 MG tablet Take 1 tablet (600 mg total) by mouth every 8 (eight) hours as needed.  losartan   (COZAAR ) 100 MG tablet Take 1 tablet (100 mg total) by mouth daily.   Misc Natural Products (SAMBUCUS ELDERBERRY IMMUNE) CHEW Chew 1 tablet by mouth daily.   Multiple Vitamin (MULTIVITAMIN ADULT PO) Take 2 tablets by mouth daily. 2 gummies by mouth daily, VitaFusion Multivitamin   No facility-administered encounter medications on file as of 10/02/2024.    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   COLONOSCOPY  2013   EYE SURGERY Bilateral 2006   lasix   FRACTURE SURGERY  2004   Tibia & Fibia   MELANOMA EXCISION Right 2018   right upper arm   SALPINGOOPHORECTOMY Right 2002   TUBAL LIGATION  1984   Family History  Problem Relation Age of Onset   Hypertension Mother    Cancer Mother    Arthritis Mother    Breast cancer Mother    Colon polyps Father    Cancer Father    Early death Father    Colon cancer Father    Heart murmur Father    Asthma Sister    Arthritis Sister    Heart murmur Sister    Arthritis Brother    Heart murmur Brother    Early death Maternal Grandfather    Heart attack Maternal Grandfather    Esophageal cancer Neg Hx    Stomach cancer Neg Hx    Rectal cancer Neg Hx    Social History   Occupational History   Not on file  Tobacco Use   Smoking status: Former    Types: Cigarettes   Smokeless tobacco: Never   Tobacco comments:    Smoked for 4 years,in her teens half pack or so  Vaping Use   Vaping status: Never Used  Substance and Sexual Activity   Alcohol use: Yes    Alcohol/week: 3.0 - 5.0 standard  drinks of alcohol    Types: 3 - 5 Glasses of wine per week    Comment: occ   Drug use: Not Currently    Types: Marijuana    Comment: used in the past-teens   Sexual activity: Not Currently   Tobacco Counseling Counseling given: Not Answered Tobacco comments: Smoked for 4 years,in her teens half pack or so  SDOH Screenings   Food Insecurity: No Food Insecurity (10/02/2024)  Housing: Low Risk  (10/02/2024)  Transportation Needs: No Transportation Needs (10/02/2024)  Utilities: Not At Risk (10/02/2024)  Alcohol Screen: Low Risk  (02/14/2023)  Depression (PHQ2-9): Low Risk  (10/02/2024)  Financial Resource Strain: Low Risk  (09/25/2023)  Physical Activity: Sufficiently Active (10/02/2024)  Social Connections: Moderately Integrated (10/02/2024)  Stress: No Stress Concern Present (10/02/2024)  Tobacco Use: Medium Risk (10/02/2024)  Health Literacy: Adequate Health Literacy (10/02/2024)   See flowsheets for full screening details  Depression Screen PHQ 2 & 9 Depression Scale- Over the past 2 weeks, how often have you been bothered by any of the following problems? Little interest or pleasure  in doing things: 0 Feeling down, depressed, or hopeless (PHQ Adolescent also includes...irritable): 0 PHQ-2 Total Score: 0     Goals Addressed               This Visit's Progress     tone up and lose a little weight (pt-stated)        Tone up and use a little weight              Objective:    Today's Vitals   10/02/24 0835  BP: 131/73  Weight: 137 lb (62.1 kg)  Height: 4' 11 (1.499 m)   Body mass index is 27.67 kg/m.  Hearing/Vision screen Hearing Screening - Comments:: Pt denies any hearing issues  Vision Screening - Comments:: Wears rx glasses - not up to date with routine eye exams with Dr Elspeth pt stated she will make appt   Immunizations and Health Maintenance Health Maintenance  Topic Date Due   COVID-19 Vaccine (8 - Pfizer risk 2025-26 season) 02/01/2025    Medicare Annual Wellness (AWV)  10/02/2025   Mammogram  12/06/2025   Colonoscopy  09/25/2028   DTaP/Tdap/Td (2 - Td or Tdap) 07/17/2033   Pneumococcal Vaccine: 50+ Years  Completed   Influenza Vaccine  Completed   Bone Density Scan  Completed   Hepatitis C Screening  Completed   Zoster Vaccines- Shingrix  Completed   Meningococcal B Vaccine  Aged Out        Assessment/Plan:  This is a routine wellness examination for Cassandra Knight.  Patient Care Team: Wendolyn Jenkins Jansky, MD as PCP - General (Family Medicine) Lonni Slain, MD as PCP - Cardiology (Cardiology)  I have personally reviewed and noted the following in the patients chart:   Medical and social history Use of alcohol, tobacco or illicit drugs  Current medications and supplements including opioid prescriptions. Functional ability and status Nutritional status Physical activity Advanced directives List of other physicians Hospitalizations, surgeries, and ER visits in previous 12 months Vitals Screenings to include cognitive, depression, and falls Referrals and appointments  No orders of the defined types were placed in this encounter.  In addition, I have reviewed and discussed with patient certain preventive protocols, quality metrics, and best practice recommendations. A written personalized care plan for preventive services as well as general preventive health recommendations were provided to patient.   Cassandra VEAR Haws, LPN   [id number]   Return in about 1 year (around 10/06/2025).  After Visit Summary: (MyChart) Due to this being a telephonic visit, the after visit summary with patients personalized plan was offered to patient via MyChart   Nurse Notes: No voiced or noted concerns at this time

## 2024-10-02 NOTE — Patient Instructions (Signed)
 Cassandra Knight,  Thank you for taking the time for your Medicare Wellness Visit. I appreciate your continued commitment to your health goals. Please review the care plan we discussed, and feel free to reach out if I can assist you further.  Please note that Annual Wellness Visits do not include a physical exam. Some assessments may be limited, especially if the visit was conducted virtually. If needed, we may recommend an in-person follow-up with your provider.  Ongoing Care Seeing your primary care provider every 3 to 6 months helps us  monitor your health and provide consistent, personalized care.   Referrals If a referral was made during today's visit and you haven't received any updates within two weeks, please contact the referred provider directly to check on the status.  Recommended Screenings:  Health Maintenance  Topic Date Due   COVID-19 Vaccine (8 - Pfizer risk 2025-26 season) 02/01/2025   Medicare Annual Wellness Visit  10/02/2025   Breast Cancer Screening  12/06/2025   Colon Cancer Screening  09/25/2028   DTaP/Tdap/Td vaccine (2 - Td or Tdap) 07/17/2033   Pneumococcal Vaccine for age over 32  Completed   Flu Shot  Completed   Osteoporosis screening with Bone Density Scan  Completed   Hepatitis C Screening  Completed   Zoster (Shingles) Vaccine  Completed   Meningitis B Vaccine  Aged Out       09/25/2023    8:33 AM  Advanced Directives  Does Patient Have a Medical Advance Directive? Yes  Type of Estate Agent of [place];Living will  Does patient want to make changes to medical advance directive? No - Patient declined  Copy of Healthcare Power of Attorney in Chart? Yes - validated most recent copy scanned in chart (See row information)    Vision: Annual vision screenings are recommended for early detection of glaucoma, cataracts, and diabetic retinopathy. These exams can also reveal signs of chronic conditions such as diabetes and high blood  pressure.  Dental: Annual dental screenings help detect early signs of oral cancer, gum disease, and other conditions linked to overall health, including heart disease and diabetes.  Please see the attached documents for additional preventive care recommendations.

## 2024-10-30 ENCOUNTER — Other Ambulatory Visit: Payer: Self-pay | Admitting: Family Medicine

## 2024-10-30 DIAGNOSIS — Z1231 Encounter for screening mammogram for malignant neoplasm of breast: Secondary | ICD-10-CM

## 2024-12-02 ENCOUNTER — Other Ambulatory Visit

## 2024-12-09 ENCOUNTER — Ambulatory Visit

## 2025-09-16 ENCOUNTER — Encounter: Admitting: Family Medicine

## 2025-10-06 ENCOUNTER — Ambulatory Visit
